# Patient Record
Sex: Male | Born: 1975 | Race: Black or African American | Hispanic: No | Marital: Married | Smoking: Current every day smoker
Health system: Southern US, Community
[De-identification: ages and names within clinical notes are randomized; demographics above are authoritative.]

## PROBLEM LIST (undated history)

## (undated) ENCOUNTER — Ambulatory Visit: Admission: EM | Payer: PRIVATE HEALTH INSURANCE

## (undated) DIAGNOSIS — M1990 Unspecified osteoarthritis, unspecified site: Secondary | ICD-10-CM

## (undated) HISTORY — PX: FRACTURE SURGERY: SHX138

## (undated) HISTORY — PX: ORTHOPEDIC SURGERY: SHX850

---

## 2013-08-08 ENCOUNTER — Emergency Department: Payer: Self-pay | Admitting: Emergency Medicine

## 2014-03-01 ENCOUNTER — Emergency Department: Payer: Self-pay | Admitting: Emergency Medicine

## 2016-12-27 ENCOUNTER — Ambulatory Visit
Admission: EM | Admit: 2016-12-27 | Discharge: 2016-12-27 | Disposition: A | Discharge status: Home / Self Care | Payer: BLUE CROSS/BLUE SHIELD | Attending: Emergency Medicine | Admitting: Emergency Medicine

## 2016-12-27 DIAGNOSIS — L03115 Cellulitis of right lower limb: Secondary | ICD-10-CM

## 2016-12-27 MED ORDER — MUPIROCIN 2 % EX OINT: 1.0000 "application " | TOPICAL_OINTMENT | Freq: Three times a day (TID) | CUTANEOUS | 0 refills | Status: DC

## 2016-12-27 MED ORDER — CEFTRIAXONE SODIUM 1 G IJ SOLR
1.0000 g | Freq: Once | INTRAMUSCULAR | Status: AC
  Administered 2016-12-27: 1 g via INTRAMUSCULAR

## 2016-12-27 MED ORDER — IBUPROFEN 800 MG PO TABS: 800.0000 mg | ORAL_TABLET | Freq: Three times a day (TID) | ORAL | 0 refills | Status: DC

## 2016-12-27 MED ORDER — CEPHALEXIN 500 MG PO CAPS: 500.0000 mg | ORAL_CAPSULE | Freq: Four times a day (QID) | ORAL | 0 refills | Status: AC

## 2016-12-27 NOTE — ED Triage Notes (Signed)
Pt with right lower leg redness and swelling above ankle. Pain 7/10 with walking, much less when still. No known injury and no travel.

## 2016-12-27 NOTE — ED Provider Notes (Signed)
HPI  SUBJECTIVE:  Jonathan Schwartz is a 41 y.o. male who presents with sharp constant anterior right lower extremity pain for the past several days. Denies pruritus or burning. Reports erythema. He reports painful swelling starting this morning. He reports chills, but no fevers, bodyaches. No trauma to the area, does not recall any insect bite or spider bite to it. No calf swelling. He's been taking Tylenol thousand milligrams every 6 hours last dose was within 6-8 hours of evaluation. Tylenol helps. Symptoms are worse with walking. He has never had symptoms like this before. No contacts with MRSA or recurrent skin infections. Past medical history negative for diabetes, hypertension, MRSA, abscesses, PE, DVT, cancer, hypercoagulability. PMD: None.    History reviewed. No pertinent past medical history.  Past Surgical History:  Procedure Laterality Date  . ORTHOPEDIC SURGERY      History reviewed. No pertinent family history.  Social History  Substance Use Topics  . Smoking status: Current Every Day Smoker    Packs/day: 0.50    Types: Cigarettes  . Smokeless tobacco: Never Used  . Alcohol use No    No current facility-administered medications for this encounter.   Current Outpatient Prescriptions:  .  acetaminophen (TYLENOL) 500 MG tablet, Take 1,000 mg by mouth as needed., Disp: , Rfl:  .  cephALEXin (KEFLEX) 500 MG capsule, Take 1 capsule (500 mg total) by mouth 4 (four) times daily. X 5 days, Disp: 20 capsule, Rfl: 0 .  ibuprofen (ADVIL,MOTRIN) 800 MG tablet, Take 1 tablet (800 mg total) by mouth 3 (three) times daily., Disp: 30 tablet, Rfl: 0 .  mupirocin ointment (BACTROBAN) 2 %, Apply 1 application topically 3 (three) times daily. Apply after warm soak for 10 minutes, Disp: 22 g, Rfl: 0  No Known Allergies   ROS  As noted in HPI.   Physical Exam  BP (!) 164/87 (BP Location: Left Arm)   Pulse (!) 110   Temp 97.6 F (36.4 C) (Oral)   Resp 20   Ht 5\' 9"  (1.753  m)   Wt 252 lb (114.3 kg)   SpO2 99%   BMI 37.21 kg/m   Constitutional: Well developed, well nourished, no acute distress Eyes:  EOMI, conjunctiva normal bilaterally HENT: Normocephalic, atraumatic,mucus membranes moist Respiratory: Normal inspiratory effort Cardiovascular: Normal rate GI: nondistended skin: No rash, skin intact Musculoskeletal: calves symmetric, nontender. Localized tenderness distal right lower extremity. Positive erythema, edema, increased temperature, mild induration anterior right lower extremity. Marked this with a permanent marker for reference measures 9.5 x 5.5 cm. Skin intact, no bruising, crusting, rash. Neurologic: Alert & oriented x 3, no focal neuro deficits Psychiatric: Speech and behavior appropriate   ED Course   Medications  cefTRIAXone (ROCEPHIN) injection 1 g (1 g Intramuscular Given 12/27/16 0929)    No orders of the defined types were placed in this encounter.   No results found for this or any previous visit (from the past 24 hour(s)). No results found.  ED Clinical Impression  Cellulitis of right lower extremity   ED Assessment/Plan  Presentation most consistent with cellulitis. Patient tachycardic, so a gram of Rocephin was given. Afebrile, but took Tylenol within 6-8 hours of evaluation.  No evidence of abscess at this time. No evidence of DVT. Plan to send home with ibuprofen 800 mg to take 1 g of Tylenol, Keflex and Bactroban.  Patient has no PMD, so instructed patient to return here in 2 days if he is not getting significantly better, he is to  go to the ED if he gets worse, fevers, chest pain, shortness of breath, calf swelling. Also provided primary care referral for routine care. Discussed labs, imaging, MDM, plan and followup with patient / parent / family. Discussed sn/sx that should prompt return to the ED. Patient / parent / family agrees with plan.   Meds ordered this encounter  Medications  . acetaminophen (TYLENOL) 500  MG tablet    Sig: Take 1,000 mg by mouth as needed.  . cefTRIAXone (ROCEPHIN) injection 1 g  . mupirocin ointment (BACTROBAN) 2 %    Sig: Apply 1 application topically 3 (three) times daily. Apply after warm soak for 10 minutes    Dispense:  22 g    Refill:  0  . cephALEXin (KEFLEX) 500 MG capsule    Sig: Take 1 capsule (500 mg total) by mouth 4 (four) times daily. X 5 days    Dispense:  20 capsule    Refill:  0  . ibuprofen (ADVIL,MOTRIN) 800 MG tablet    Sig: Take 1 tablet (800 mg total) by mouth 3 (three) times daily.    Dispense:  30 tablet    Refill:  0    *This clinic note was created using Scientist, clinical (histocompatibility and immunogenetics)Dragon dictation software. Therefore, there may be occasional mistakes despite careful proofreading.  ?   Domenick GongAshley Dijon Cosens, MD 12/27/16 [phone]

## 2021-03-12 ENCOUNTER — Encounter: Payer: Self-pay | Admitting: Emergency Medicine

## 2021-03-12 ENCOUNTER — Ambulatory Visit
Admission: EM | Admit: 2021-03-12 | Discharge: 2021-03-12 | Disposition: A | Discharge status: Home / Self Care | Payer: PRIVATE HEALTH INSURANCE | Attending: Family Medicine | Admitting: Family Medicine

## 2021-03-12 ENCOUNTER — Other Ambulatory Visit: Payer: Self-pay

## 2021-03-12 DIAGNOSIS — U071 COVID-19: Secondary | ICD-10-CM | POA: Diagnosis present

## 2021-03-12 LAB — SARS CORONAVIRUS 2 (TAT 6-24 HRS): SARS Coronavirus 2: POSITIVE — AB

## 2021-03-12 MED ORDER — MOLNUPIRAVIR 200 MG PO CAPS: 800.0000 mg | ORAL_CAPSULE | Freq: Two times a day (BID) | ORAL | 0 refills | Status: AC

## 2021-03-12 NOTE — ED Provider Notes (Signed)
[facility]    CSN: [id number] Arrival date & time: 03/12/21  6440      History   Chief Complaint Chief Complaint  Patient presents with  . Cough  . Nasal Congestion    HPI  45 year old male presents with the above complaints.  Patient reports that his symptoms started on Saturday.  He thought he was experiencing allergies but his symptoms have persisted and not improved.  He reports nasal congestion, headache, generalized weakness, cough.  No fever.  He has been "sweating".  He had a COVID test that was positive this morning.  He notified his employer and was advised to come in for evaluation.  No relieving factors.  No other complaints.  Home Medications    Prior to Admission medications   Medication Sig Start Date End Date Taking? Authorizing Provider  Molnupiravir 200 MG CAPS Take 4 capsules (800 mg total) by mouth in the morning and at bedtime for 5 days. 03/12/21 03/17/21 Yes Tommie Sams, DO    Family History History reviewed. No pertinent family history.  Social History Social History   Tobacco Use  . Smoking status: Current Every Day Smoker    Packs/day: 0.50    Types: Cigarettes  . Smokeless tobacco: Never Used  Substance Use Topics  . Alcohol use: No  . Drug use: No     Allergies   Patient has no known allergies.   Review of Systems Review of Systems  Constitutional: Negative for fever.  HENT: Positive for congestion.   Respiratory: Positive for cough.   Neurological: Positive for headaches.   Physical Exam Triage Vital Signs ED Triage Vitals  Enc Vitals Group     BP 03/12/21 0947 133/83     Pulse Rate 03/12/21 0947 96     Resp 03/12/21 0947 18     Temp 03/12/21 0947 98.4 F (36.9 C)     Temp Source 03/12/21 0947 Oral     SpO2 03/12/21 0947 99 %     Weight 03/12/21 0947 262 lb (118.8 kg)     Height 03/12/21 0947 5\' 9"  (1.753 m)     Head Circumference --      Peak Flow --      Pain Score 03/12/21 0945 0     Pain Loc --       Pain Edu? --      Excl. in GC? --    Updated Vital Signs BP 133/83 (BP Location: Right Arm)   Pulse 96   Temp 98.4 F (36.9 C) (Oral)   Resp 18   Ht 5\' 9"  (1.753 m)   Wt 118.8 kg   SpO2 99%   BMI 38.69 kg/m   Visual Acuity Right Eye Distance:   Left Eye Distance:   Bilateral Distance:    Right Eye Near:   Left Eye Near:    Bilateral Near:     Physical Exam Vitals and nursing note reviewed.  Constitutional:      General: He is not in acute distress.    Appearance: Normal appearance. He is obese. He is not ill-appearing.  HENT:     Head: Normocephalic and atraumatic.     Right Ear: Tympanic membrane normal.     Left Ear: Tympanic membrane normal.     Nose: Congestion present.     Mouth/Throat:     Pharynx: Oropharynx is clear.  Eyes:     General:        Right eye: No discharge.  Left eye: No discharge.     Conjunctiva/sclera: Conjunctivae normal.  Cardiovascular:     Rate and Rhythm: Normal rate and regular rhythm.  Pulmonary:     Effort: Pulmonary effort is normal.     Breath sounds: Normal breath sounds. No wheezing, rhonchi or rales.  Neurological:     Mental Status: He is alert.  Psychiatric:        Mood and Affect: Mood normal.        Behavior: Behavior normal.    UC Treatments / Results  Labs (all labs ordered are listed, but only abnormal results are displayed) Labs Reviewed  SARS CORONAVIRUS 2 (TAT 6-24 HRS)    EKG   Radiology No results found.  Procedures Procedures (including critical care time)  Medications Ordered in UC Medications - No data to display  Initial Impression / Assessment and Plan / UC Course  I have reviewed the triage vital signs and the nursing notes.  Pertinent labs & imaging results that were available during my care of the patient were reviewed by me and considered in my medical decision making (see chart for details).    45 year old male presents with COVID-19.  Patient had a positive home test.  He is  a smoker and is obese.  Given risk factors, placing on molnupiravir.  Information given regarding quarantine and when he may return to work.  Work note given.  Supportive care.  Final Clinical Impressions(s) / UC Diagnoses   Final diagnoses:  COVID     Discharge Instructions     Medication as prescribed.  If you worsen, go the ER.  Quarantine for 5 days (from Saturday).   Take care  Dr. Adriana Simas    ED Prescriptions    Medication Sig Dispense Auth. Provider   Molnupiravir 200 MG CAPS Take 4 capsules (800 mg total) by mouth in the morning and at bedtime for 5 days. 40 capsule Tommie Sams, DO     PDMP not reviewed this encounter.   Tommie Sams, [place] 03/12/21 1039

## 2021-03-12 NOTE — Discharge Instructions (Signed)
Medication as prescribed.  If you worsen, go the ER.  Quarantine for 5 days (from Saturday).   Take care  Dr. Adriana Simas

## 2021-03-12 NOTE — ED Triage Notes (Signed)
Patient had positive home COVID test today. He wants to take another test today to confirm this. He has been having cough, nasal congestion that started Saturday.

## 2021-03-13 ENCOUNTER — Telehealth (HOSPITAL_COMMUNITY): Payer: Self-pay

## 2021-03-13 NOTE — Telephone Encounter (Signed)
Called to discuss with patient about COVID-19 symptoms and the use of one of the available treatments for those with mild to moderate Covid symptoms and at a high risk of hospitalization.  Pt appears to qualify for outpatient treatment due to co-morbid conditions and/or a member of an at-risk group in accordance with the FDA Emergency Use Authorization.  ED MD rx molnupiravir for patient, calling to screen for monoclonal antibody treatment.     Unable to reach pt - LVM 03/13/21 @ 1734  Essie Hart, RN

## 2021-03-13 NOTE — Telephone Encounter (Signed)
Patient's wife Jonathan Schwartz called RN back about additional therapy. RN spoke with PA, Carlean Jews about possibly starting paxlovid as well. Since pt has been vaccinated and is feeling better no need for additional therapy at this time.

## 2021-07-18 ENCOUNTER — Other Ambulatory Visit: Payer: Self-pay

## 2021-07-18 ENCOUNTER — Ambulatory Visit (INDEPENDENT_AMBULATORY_CARE_PROVIDER_SITE_OTHER): Payer: PRIVATE HEALTH INSURANCE

## 2021-07-18 ENCOUNTER — Ambulatory Visit
Admission: EM | Admit: 2021-07-18 | Discharge: 2021-07-18 | Disposition: A | Discharge status: Home / Self Care | Payer: PRIVATE HEALTH INSURANCE | Attending: Family Medicine | Admitting: Family Medicine

## 2021-07-18 ENCOUNTER — Encounter: Payer: Self-pay | Admitting: Emergency Medicine

## 2021-07-18 DIAGNOSIS — M79642 Pain in left hand: Secondary | ICD-10-CM | POA: Diagnosis not present

## 2021-07-18 DIAGNOSIS — M79632 Pain in left forearm: Secondary | ICD-10-CM | POA: Diagnosis not present

## 2021-07-18 DIAGNOSIS — M25532 Pain in left wrist: Secondary | ICD-10-CM

## 2021-07-18 MED ORDER — MELOXICAM 15 MG PO TABS: 15.0000 mg | ORAL_TABLET | Freq: Every day | ORAL | 0 refills | Status: DC | PRN

## 2021-07-18 NOTE — ED Provider Notes (Signed)
[facility]    CSN: [id number] Arrival date & time: 07/18/21  0941      History   Chief Complaint Chief Complaint  Patient presents with   Wrist Pain    Left forearm    HPI  45 year old male presents with the above complaints.  Patient reports that he has had ongoing pain intermittently of his left wrist and forearm.  Has been worse as of late.  He is left-handed.  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  Pain 7/10 in severity.  No fall, trauma, injury.  He has had prior fracture and subsequent fixation of the forearm.  No other associated symptoms.  No other complaints.  Home Medications    Prior to Admission medications   Medication Sig Start Date End Date Taking? Authorizing Provider  acetaminophen (TYLENOL) 500 MG tablet Take 500 mg by mouth every 6 (six) hours as needed.   Yes [provider]  meloxicam (MOBIC) 15 MG tablet Take 1 tablet (15 mg total) by mouth daily as needed for pain. 07/18/21  Yes Tommie Sams, DO    Social History Social History   Tobacco Use   Smoking status: Every Day    Packs/day: 0.50    Types: Cigarettes   Smokeless tobacco: Never  Substance Use Topics   Alcohol use: No   Drug use: No     Allergies   Patient has no known allergies.   Review of Systems Review of Systems  Constitutional: Negative.   Musculoskeletal:        Left wrist pain and left forearm pain.    Physical Exam Triage Vital Signs ED Triage Vitals  Enc Vitals Group     BP 07/18/21 1057 133/86     Pulse Rate 07/18/21 1057 92     Resp 07/18/21 1057 20     Temp 07/18/21 1057 98.1 F (36.7 C)     Temp Source 07/18/21 1057 Oral     SpO2 07/18/21 1057 99 %     Weight --      Height --      Head Circumference --      Peak Flow --      Pain Score 07/18/21 1054 7     Pain Loc --      Pain Edu? --      Excl. in GC? --    No data found.BP 133/86 (BP Location: Right Arm)   Pulse 92   Temp 98.1 F (36.7 C) (Oral)   Resp 20   SpO2 99%   Visual  Acuity Right Eye Distance:   Left Eye Distance:   Bilateral Distance:    Right Eye Near:   Left Eye Near:    Bilateral Near:     Physical Exam Vitals and nursing note reviewed.  Constitutional:      General: He is not in acute distress.    Appearance: He is not ill-appearing.  HENT:     Head: Normocephalic and atraumatic.  Pulmonary:     Effort: Pulmonary effort is normal. No respiratory distress.  Musculoskeletal:     Comments: Left wrist and forearm - diffuse tenderness to palpation throughout the wrist and distal forearm.  Midline scar noted on the volar aspect of the forearm from prior surgery.  Neurological:     Mental Status: He is alert.  Psychiatric:        Mood and Affect: Mood normal.        Behavior: Behavior normal.  UC Treatments / Results  Labs (all labs ordered are listed, but only abnormal results are displayed) Labs Reviewed - No data to display  EKG   Radiology DG Forearm Left  Result Date: 07/18/2021 CLINICAL DATA:  Pain EXAM: LEFT HAND - COMPLETE 3+ VIEW; LEFT FOREARM - 2 VIEW COMPARISON:  None. FINDINGS: Left forearm: There is no evidence of fracture or dislocation. Chronic osseous deformity of the radius with prior plate and screw fixation. Hardware is intact. Soft tissues are unremarkable. Left hand: There is no evidence of fracture or dislocation. No significant arthropathy or focal bony lesion. Soft tissues are unremarkable. IMPRESSION: No acute osseous abnormality. Electronically Signed   By: Allegra Lai M.D.   On: 07/18/2021 12:00   DG Hand Complete Left  Result Date: 07/18/2021 CLINICAL DATA:  Pain EXAM: LEFT HAND - COMPLETE 3+ VIEW; LEFT FOREARM - 2 VIEW COMPARISON:  None. FINDINGS: Left forearm: There is no evidence of fracture or dislocation. Chronic osseous deformity of the radius with prior plate and screw fixation. Hardware is intact. Soft tissues are unremarkable. Left hand: There is no evidence of fracture or dislocation. No  significant arthropathy or focal bony lesion. Soft tissues are unremarkable. IMPRESSION: No acute osseous abnormality. Electronically Signed   By: Allegra Lai M.D.   On: 07/18/2021 12:00    Procedures Procedures (including critical care time)  Medications Ordered in UC Medications - No data to display  Initial Impression / Assessment and Plan / UC Course  I have reviewed the triage vital signs and the nursing notes.  Pertinent labs & imaging results that were available during my care of the patient were reviewed by me and considered in my medical decision making (see chart for details).    45 year old male presents with pain of the left wrist and forearm.  Diffusely tender on exam.  X-rays was obtained and was independently reviewed by me.  Interpretation: Hardware in place.  No appreciable fracture.  Advise rest, ice, elevation.  Meloxicam as directed.  If persists, needs to see orthopedics.  Final Clinical Impressions(s) / UC Diagnoses   Final diagnoses:  Left wrist pain  Pain of left forearm     Discharge Instructions      Rest, ice, elevation.  Medication as prescribed.  Please call [facility] clinic Orthopedics [phone]) OR [facility] [phone]) for an appt.    ED Prescriptions     Medication Sig Dispense Auth. Provider   meloxicam (MOBIC) 15 MG tablet Take 1 tablet (15 mg total) by mouth daily as needed for pain. 30 tablet Tommie Sams, DO      PDMP not reviewed this encounter.   Tommie Sams, DO 07/18/21 1340

## 2021-07-18 NOTE — ED Triage Notes (Signed)
Pt presents today with c/o of "discomfort" to left forearm x 1 day. He denies new injury. He does report a history of surgical repair to left forearm.

## 2021-07-18 NOTE — Discharge Instructions (Addendum)
Rest, ice, elevation.  Medication as prescribed.  Please call [facility] clinic Orthopedics [phone]) OR [facility] [phone]) for an appt.

## 2021-09-27 ENCOUNTER — Emergency Department: Payer: BLUE CROSS/BLUE SHIELD

## 2021-09-27 ENCOUNTER — Encounter: Payer: Self-pay | Admitting: Emergency Medicine

## 2021-09-27 DIAGNOSIS — W19XXXA Unspecified fall, initial encounter: Secondary | ICD-10-CM | POA: Insufficient documentation

## 2021-09-27 DIAGNOSIS — Z23 Encounter for immunization: Secondary | ICD-10-CM | POA: Diagnosis not present

## 2021-09-27 DIAGNOSIS — S0181XA Laceration without foreign body of other part of head, initial encounter: Secondary | ICD-10-CM | POA: Insufficient documentation

## 2021-09-27 DIAGNOSIS — R55 Syncope and collapse: Secondary | ICD-10-CM | POA: Diagnosis not present

## 2021-09-27 DIAGNOSIS — Z20822 Contact with and (suspected) exposure to covid-19: Secondary | ICD-10-CM | POA: Diagnosis not present

## 2021-09-27 DIAGNOSIS — F17210 Nicotine dependence, cigarettes, uncomplicated: Secondary | ICD-10-CM | POA: Insufficient documentation

## 2021-09-27 DIAGNOSIS — S0990XA Unspecified injury of head, initial encounter: Secondary | ICD-10-CM | POA: Diagnosis present

## 2021-09-27 NOTE — ED Triage Notes (Signed)
Pt in via [facility] after syncopal fall at work, where he fell and landed forward onto pallet jacks, laceration present to forehead between eyebrows. Also has swelling to nose, and some chipped teeth. Arrives a&ox4, VSS

## 2021-09-28 ENCOUNTER — Emergency Department
Admission: EM | Admit: 2021-09-28 | Discharge: 2021-09-28 | Disposition: A | Discharge status: Home / Self Care | Payer: BLUE CROSS/BLUE SHIELD | Attending: Emergency Medicine | Admitting: Emergency Medicine

## 2021-09-28 ENCOUNTER — Encounter: Payer: Self-pay | Admitting: Emergency Medicine

## 2021-09-28 ENCOUNTER — Emergency Department: Payer: BLUE CROSS/BLUE SHIELD

## 2021-09-28 DIAGNOSIS — S0181XA Laceration without foreign body of other part of head, initial encounter: Secondary | ICD-10-CM

## 2021-09-28 DIAGNOSIS — R55 Syncope and collapse: Secondary | ICD-10-CM

## 2021-09-28 DIAGNOSIS — S0990XA Unspecified injury of head, initial encounter: Secondary | ICD-10-CM

## 2021-09-28 LAB — TROPONIN I (HIGH SENSITIVITY)
Troponin I (High Sensitivity): 7 ng/L (ref <18)
Troponin I (High Sensitivity): 8 ng/L (ref <18)

## 2021-09-28 LAB — CBC
HCT: 41.5 % (ref 39.0–52.0)
Hemoglobin: 14.7 g/dL (ref 13.0–17.0)
MCH: 33 pg (ref 26.0–34.0)
MCHC: 35.4 g/dL (ref 30.0–36.0)
MCV: 93 fL (ref 80.0–100.0)
Platelets: 289 10*3/uL (ref 150–400)
RBC: 4.46 MIL/uL (ref 4.22–5.81)
RDW: 12.4 % (ref 11.5–15.5)
WBC: 11.9 10*3/uL — ABNORMAL HIGH (ref 4.0–10.5)
nRBC: 0 % (ref 0.0–0.2)

## 2021-09-28 LAB — BASIC METABOLIC PANEL
Anion gap: 8 (ref 5–15)
BUN: 20 mg/dL (ref 6–20)
CO2: 25 mmol/L (ref 22–32)
Calcium: 9.5 mg/dL (ref 8.9–10.3)
Chloride: 108 mmol/L (ref 98–111)
Creatinine, Ser: 0.97 mg/dL (ref 0.61–1.24)
GFR, Estimated: >60 mL/min (ref >60)
Glucose, Bld: 149 mg/dL — ABNORMAL HIGH (ref 70–99)
Potassium: 3.5 mmol/L (ref 3.5–5.1)
Sodium: 141 mmol/L (ref 135–145)

## 2021-09-28 LAB — RESP PANEL BY RT-PCR (FLU A&B, COVID) ARPGX2
Influenza A by PCR: NEGATIVE
Influenza B by PCR: NEGATIVE
SARS Coronavirus 2 by RT PCR: NEGATIVE

## 2021-09-28 MED ORDER — LIDOCAINE HCL (PF) 1 % IJ SOLN
5.0000 mL | Freq: Once | INTRAMUSCULAR | Status: AC
  Administered 2021-09-28: 5 mL
  Filled 2021-09-28: qty 5

## 2021-09-28 MED ORDER — TETANUS-DIPHTH-ACELL PERTUSSIS 5-2.5-18.5 LF-MCG/0.5 IM SUSY
0.5000 mL | PREFILLED_SYRINGE | Freq: Once | INTRAMUSCULAR | Status: AC
  Administered 2021-09-28: 0.5 mL via INTRAMUSCULAR
  Filled 2021-09-28: qty 0.5

## 2021-09-28 NOTE — ED Provider Notes (Signed)
[facility] Emergency Department Provider Note   ____________________________________________    I have reviewed the triage vital signs and the nursing notes.   HISTORY  Chief Complaint Head Injury and Loss of Consciousness     HPI Jonathan Schwartz is a 45 y.o. male who presents after syncopal episode with head injury.  Patient reports he was at work, feeling somewhat fatigued has had a cough over the last several days.  Reports he had a particularly hard cough which made him feel lightheaded and he apparently passed out and fell forward.  He woke up on the floor with some bleeding from his forehead.  Currently is feeling well has no complaints.  No palpitations or chest pain.  No extremity injuries.  No new medications.  History reviewed. No pertinent past medical history.  There are no problems to display for this patient.   Past Surgical History:  Procedure Laterality Date   ORTHOPEDIC SURGERY      Prior to Admission medications   Medication Sig Start Date End Date Taking? Authorizing Provider  acetaminophen (TYLENOL) 500 MG tablet Take 500 mg by mouth every 6 (six) hours as needed.    [provider]  meloxicam (MOBIC) 15 MG tablet Take 1 tablet (15 mg total) by mouth daily as needed for pain. 07/18/21   Tommie Sams, DO     Allergies Patient has no known allergies.  No family history on file.  Social History Social History   Tobacco Use   Smoking status: Every Day    Packs/day: 0.50    Types: Cigarettes   Smokeless tobacco: Never  Substance Use Topics   Alcohol use: No   Drug use: No    Review of Systems  Constitutional: No fever/chills Eyes: No visual changes.  ENT: Chipped tooth Cardiovascular: Denies chest pain. Respiratory: Denies shortness of breath. Gastrointestinal: No abdominal pain.  No nausea, no vomiting.   Genitourinary: Negative for dysuria. Musculoskeletal: Negative for back pain. Skin:  Positive laceration Neurological: Negative for headaches or weakness   ____________________________________________   PHYSICAL EXAM:  VITAL SIGNS: ED Triage Vitals [09/27/21 2340]  Enc Vitals Group     BP 133/88     Pulse Rate 92     Resp 18     Temp 98.2 F (36.8 C)     Temp Source Oral     SpO2 96 %     Weight      Height      Head Circumference      Peak Flow      Pain Score      Pain Loc      Pain Edu?      Excl. in GC?     Constitutional: Alert and oriented. No acute distress. Pleasant and interactive Eyes: Conjunctivae are normal.  Head: Swelling to the bridge of the nose, swelling to the upper lip, laceration, 3 cm horizontal central forehead Nose: Swelling as above, no rhinorrhea Mouth/Throat: Mucous membranes are moist.  Chipped left upper incisor Neck:  Painless ROM, no pain with axial load Cardiovascular: Normal rate, regular rhythm. Grossly normal heart sounds.  Good peripheral circulation.  No chest wall tenderness to palpation Respiratory: Normal respiratory effort.  No retractions. Lungs CTAB. Gastrointestinal: Soft and nontender. No distention.  No CVA tenderness. Genitourinary: deferred Musculoskeletal: No lower extremity tenderness nor edema.  Warm and well perfused.  Full range of motion of all extremities Neurologic:  Normal speech and language. No gross focal  neurologic deficits are appreciated.  Skin:  Skin is warm, dry, laceration as above Psychiatric: Mood and affect are normal. Speech and behavior are normal.  ____________________________________________   LABS (all labs ordered are listed, but only abnormal results are displayed)  Labs Reviewed  BASIC METABOLIC PANEL - Abnormal; Notable for the following components:      Result Value   Glucose, Bld 149 (*)    All other components within normal limits  CBC - Abnormal; Notable for the following components:   WBC 11.9 (*)    All other components within normal limits  RESP PANEL BY RT-PCR  (FLU A&B, COVID) ARPGX2  URINALYSIS, ROUTINE W REFLEX MICROSCOPIC  TROPONIN I (HIGH SENSITIVITY)  TROPONIN I (HIGH SENSITIVITY)   ____________________________________________  EKG  ED ECG REPORT I, Jene Every, the attending physician, personally viewed and interpreted this ECG.  Date: 09/28/2021  Rhythm: normal sinus rhythm QRS Axis: normal Intervals: normal ST/T Wave abnormalities: Nonspecific changes Narrative Interpretation: no evidence of acute ischemia  ____________________________________________  RADIOLOGY  CT head cervical spine max face reviewed by me, no acute fractures, confirmed by radiology ____________________________________________   PROCEDURES  Procedure(s) performed: yes  .Marland KitchenLaceration Repair  Date/Time: 09/28/2021 9:38 AM Performed by: Jene Every, MD Authorized by: Jene Every, MD   Consent:    Consent obtained:  Verbal   Risks discussed:  Infection, pain and poor cosmetic result Anesthesia:    Anesthesia method:  Local infiltration   Local anesthetic:  Lidocaine 1% w/o epi Laceration details:    Location:  Face   Face location:  Forehead   Length (cm):  3 Exploration:    Wound exploration: entire depth of wound visualized     Contaminated: no   Treatment:    Area cleansed with:  Saline   Amount of cleaning:  Standard Skin repair:    Repair method:  Sutures   Suture size:  5-0   Suture material:  Nylon   Suture technique:  Simple interrupted   Number of sutures:  4 Approximation:    Approximation:  Close Post-procedure details:    Procedure completion:  Tolerated   Critical Care performed: No ____________________________________________   INITIAL IMPRESSION / ASSESSMENT AND PLAN / ED COURSE  Pertinent labs & imaging results that were available during my care of the patient were reviewed by me and considered in my medical decision making (see chart for details).   Patient with syncopal episode as described above  after a particularly harsh cough, possible vasovagal event.  Overall well-appearing vital signs reassuring here.  EKG troponin unremarkable.  CT imaging is reassuring, no broken bones.  Did suffer a chipped upper front tooth.  Laceration to the forehead repaired by me, suture removal 5 days.  Home with rest hydration outpatient follow-up, return precautions discussed    ____________________________________________   FINAL CLINICAL IMPRESSION(S) / ED DIAGNOSES  Final diagnoses:  Syncope and collapse  Injury of head, initial encounter  Forehead laceration, initial encounter        Note:  This document was prepared using Dragon voice recognition software and may include unintentional dictation errors.    Jene Every, MD 09/28/21 [phone]

## 2021-09-28 NOTE — ED Provider Notes (Signed)
HPI: Pt is a 45 y.o. male who presents with complaints of syncope  The patient p/w  coughing and then syncope episode. Hit head. Unsure tetanus No chest pain, no abd pain.   ROS: Denies fever, chest pain, vomiting  History reviewed. No pertinent past medical history. Vitals:   09/27/21 2340 09/28/21 0422  BP: 133/88 126/83  Pulse: 92 97  Resp: 18 20  Temp: 98.2 F (36.8 C) 98.1 F (36.7 C)  SpO2: 96% 95%    Focused Physical Exam: Gen: No acute distress Head: atraumatic, normocephalic Eyes: Extraocular movements grossly intact; conjunctiva clear CV: RRR Lung: No increased WOB, no stridor GI: ND, no obvious masses Neuro: Alert and awake  Medical Decision Making and Plan: Given the patient's initial medical screening exam, the following diagnostic evaluation has been ordered. The patient will be placed in the appropriate treatment space, once one is available, to complete the evaluation and treatment. I have discussed the plan of care with the patient and I have advised the patient that an ED physician or mid-level practitioner will reevaluate their condition after the test results have been received, as the results may give them additional insight into the type of treatment they may need.   Diagnostics: ct   Treatments: none immediately   Concha Se, MD 09/28/21 [phone]

## 2022-01-14 ENCOUNTER — Other Ambulatory Visit: Payer: Self-pay

## 2022-01-14 ENCOUNTER — Ambulatory Visit
Admission: EM | Admit: 2022-01-14 | Discharge: 2022-01-14 | Disposition: A | Discharge status: Home / Self Care | Payer: Self-pay | Attending: Internal Medicine | Admitting: Internal Medicine

## 2022-01-14 DIAGNOSIS — M25562 Pain in left knee: Secondary | ICD-10-CM

## 2022-01-14 MED ORDER — KETOROLAC TROMETHAMINE 60 MG/2ML IM SOLN
60.0000 mg | Freq: Once | INTRAMUSCULAR | Status: AC
  Administered 2022-01-14: 60 mg via INTRAMUSCULAR

## 2022-01-14 MED ORDER — NAPROXEN 500 MG PO TABS: 500.0000 mg | ORAL_TABLET | Freq: Two times a day (BID) | ORAL | 0 refills | Status: DC

## 2022-01-14 NOTE — Discharge Instructions (Addendum)
Symptoms and exam today are consistent with episodic knee inflammation, caused by something like gout.  Good partial relief occurred within 35 minutes of an injection of ketorolac (anti inflammatory/pain reliever) today at the urgent care.  Prescription for naprosyn (anti inflammatory/pain reliever) was sent to the pharmacy.  Please start this medicine tonight and continue it until left knee pain has been resolved for 2-3 days.  Ice to the knee for 10-15 minutes several times daily may also provide some pain relief.  Note for work. Recheck or followup with a sports med provider or orthopedist as needed if symptoms do not resolve as expected.

## 2022-01-14 NOTE — ED Triage Notes (Signed)
Pt c/o Left knee   Pt states that his left knee does not have any cartilage and has been swelling x1week.

## 2022-01-15 NOTE — ED Provider Notes (Signed)
[facility]    CSN: [id number] Arrival date & time: 01/14/22  1301      History   Chief Complaint Chief Complaint  Patient presents with   Knee Pain   Leg Pain    HPI Jonathan Schwartz is a 46 y.o. male. He presents today with severe constant pain in the L knee that has increased steadily over the last couple days, after starting intermittently about a week ago.  He was sent to the urgent care from work after arriving there unable to work due to pain in the left knee/leg.   He attributes the pain to "low cartilage" in the L knee, from playing football in high school.  He does not call a specific recent event that triggered the onset of this pain, thinks it might be from getting into/out of work and personal vehicles at different heights from the ground.  Was not struck and has not fallen.   Has had episodes of pain in the past; pain does not tend to persist between episodes.   Looks miserable--can hardly stand still and uncomfortable to sit also because of pain with knee position.  Wife had to help him put socks on this am. Tylenol is not providing much relief.  Has tried elevating the leg and trying to stay off it.  Couldn't sleep last night.  Does not have an orthopedist or sports med provider.  HPI  History reviewed. No pertinent past medical history.  There are no problems to display for this patient.   Past Surgical History:  Procedure Laterality Date   ORTHOPEDIC SURGERY         Home Medications    Prior to Admission medications   Medication Sig Start Date End Date Taking? Authorizing Provider  acetaminophen (TYLENOL) 500 MG tablet Take 500 mg by mouth every 6 (six) hours as needed.   Yes [provider]  meloxicam (MOBIC) 15 MG tablet Take 1 tablet (15 mg total) by mouth daily as needed for pain. 07/18/21  Yes Cook, Jayce G, DO  naproxen (NAPROSYN) 500 MG tablet Take 1 tablet (500 mg total) by mouth 2 (two) times daily. Continue medicine for  2-3 days after left knee pain has resolved 01/14/22  Yes [place] Scrape Renie Ora, MD    Family History History reviewed. No pertinent family history.  Social History Social History   Tobacco Use   Smoking status: Every Day    Packs/day: 0.50    Types: Cigarettes   Smokeless tobacco: Never  Vaping Use   Vaping Use: Never used  Substance Use Topics   Alcohol use: No   Drug use: No     Allergies   Patient has no known allergies.   Review of Systems Review of Systems see HPI   Physical Exam Triage Vital Signs ED Triage Vitals  Enc Vitals Group     BP 01/14/22 1410 (!) 151/96     Pulse Rate 01/14/22 1410 93     Resp 01/14/22 1410 18     Temp 01/14/22 1410 98.1 F (36.7 C)     Temp Source 01/14/22 1410 Oral     SpO2 01/14/22 1410 98 %     Weight 01/14/22 1407 262 lb (118.8 kg)     Height 01/14/22 1407 5\' 9"  (1.753 m)     Pain Score 01/14/22 1407 9     Pain Loc --    Updated Vital Signs BP (!) 151/96 (BP Location: Left Arm)    Pulse 93  Temp 98.1 F (36.7 C) (Oral)    Resp 18    Ht 5\' 9"  (1.753 m)    Wt 118.8 kg    SpO2 98%    BMI 38.69 kg/m   Physical Exam Constitutional:      General: He is in acute distress.     Appearance: He is not ill-appearing.     Comments: Good hygiene Looks miserable, grimacing and standing hunched over exam table  HENT:     Head: Atraumatic.     Mouth/Throat:     Mouth: Mucous membranes are moist.  Eyes:     Conjunctiva/sclera:     Right eye: Right conjunctiva is not injected. No exudate.    Left eye: Left conjunctiva is not injected. No exudate.    Comments: Conjugate gaze observed  Cardiovascular:     Rate and Rhythm: Normal rate.  Pulmonary:     Effort: Pulmonary effort is normal. No respiratory distress.  Abdominal:     General: There is no distension.  Musculoskeletal:     Cervical back: Neck supple.       Legs:     Comments: Marked limping, but walked into urgent care without assistive device or chair Subtle warmth  and effusion to left knee compared to right Position of least discomfort for left knee is extended to just less than full 180 extension, not able to flex or extend knee outside of this without extreme discomfort No bruising, erythema appreciated Very painful to weight bear Knee is painful to palpation, diffusely and particularly in site marked on diagram  Skin:    General: Skin is warm and dry.     Comments: No rash, no cyanosis  Neurological:     Mental Status: He is alert.     Comments: Face symmetric, speech clear/coherent/logical     UC Treatments / Results  Labs (all labs ordered are listed, but only abnormal results are displayed) Labs Reviewed - No data to display NA  EKG NA  Radiology No results found. NA  Procedures Procedures (including critical care time) NA  Medications Ordered in UC Medications  ketorolac (TORADOL) injection 60 mg (60 mg Intramuscular Given 01/14/22 1455)    Initial Impression / Assessment and Plan / UC Course  Tremendous decrease in pain within 35 minutes of toradol injection strongly suggests inflammatory component.  Differential dx includes gout, pseudogout, other inflammatory arthropathy.  Patient says possibility of gout has been discussed with him in the past.     Final Clinical Impressions(s) / UC Diagnoses   Final diagnoses:  Acute pain of left knee     Discharge Instructions      Symptoms and exam today are consistent with episodic knee inflammation, caused by something like gout.  Good partial relief occurred within 35 minutes of an injection of ketorolac (anti inflammatory/pain reliever) today at the urgent care.  Prescription for naprosyn (anti inflammatory/pain reliever) was sent to the pharmacy.  Please start this medicine tonight and continue it until left knee pain has been resolved for 2-3 days.  Ice to the knee for 10-15 minutes several times daily may also provide some pain relief.  Note for work. Recheck or followup  with a sports med provider or orthopedist as needed if symptoms do not resolve as expected.   ED Prescriptions     Medication Sig Dispense Auth. Provider   naproxen (NAPROSYN) 500 MG tablet Take 1 tablet (500 mg total) by mouth 2 (two) times daily. Continue medicine for 2-3 days  after left knee pain has resolved 30 tablet Isa Rankin, MD      PDMP not reviewed this encounter.   Isa Rankin, MD 01/15/22 1054

## 2022-05-09 IMAGING — CT CT MAXILLOFACIAL W/O CM
3 of 4 series · 13 of 47 positions shown, 16 images · non-contrast
Comparison: None.

CLINICAL DATA: Facial trauma; Neck trauma, midline tenderness (Age
16-64y). Fall with face laceration and swelling

EXAM:
CT HEAD WITHOUT CONTRAST
CT MAXILLOFACIAL WITHOUT CONTRAST
CT CERVICAL SPINE WITHOUT CONTRAST
TECHNIQUE: Multidetector CT imaging of the head, cervical spine, and
maxillofacial structures were performed using the standard protocol
without intravenous contrast. Multiplanar CT image reconstructions
of the cervical spine and maxillofacial structures were also
generated.

[Series 2: max soft · axial · 0.40mm/px · z∈[-183,-33]mm · 9 of 87 slices shown, 12 images]
[im 6/87  brain]
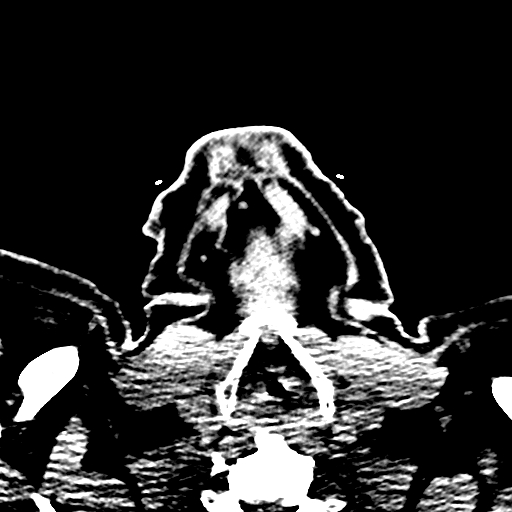
[im 6/87  bone]
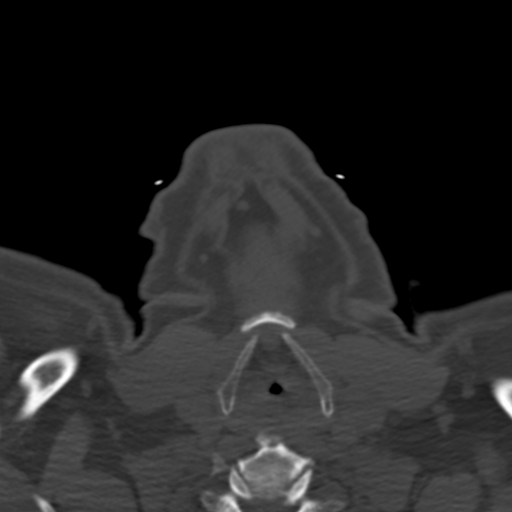
[im 15/87  bone]
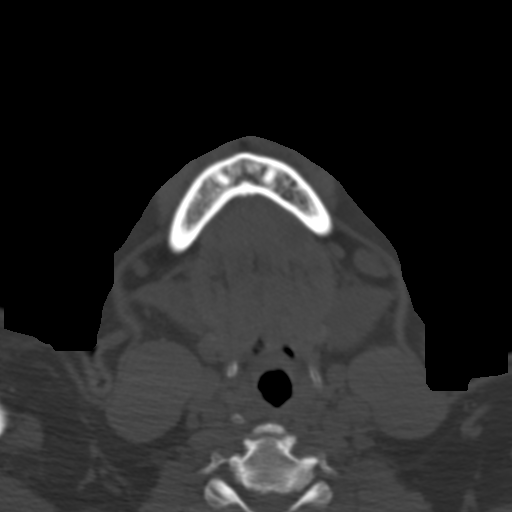
[im 24/87  bone]
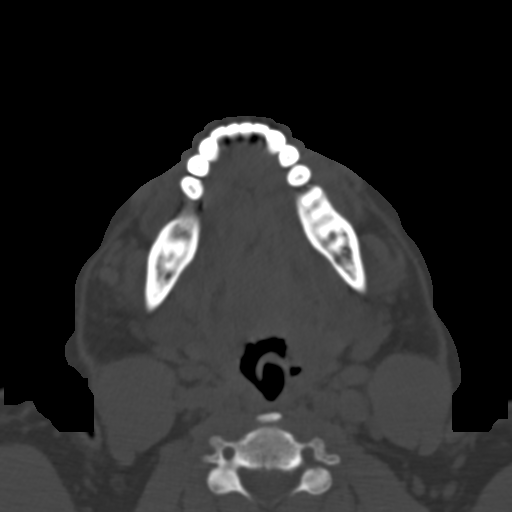
[im 33/87  bone]
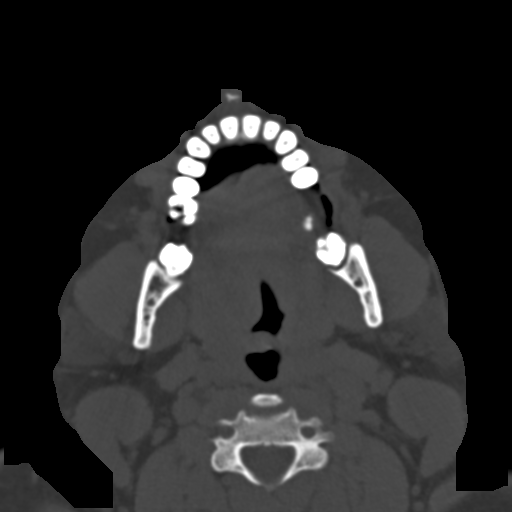
[im 45/87  brain]
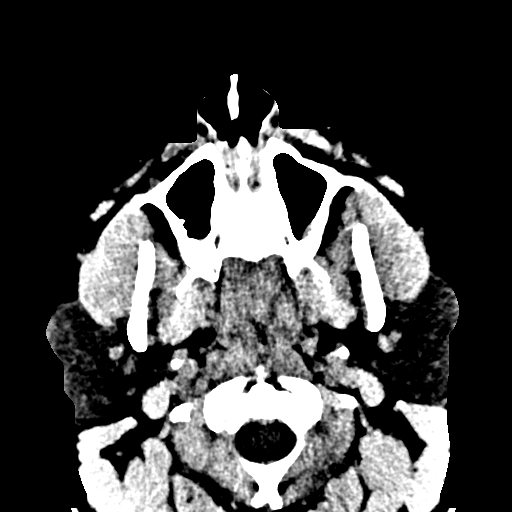
[im 45/87  bone]
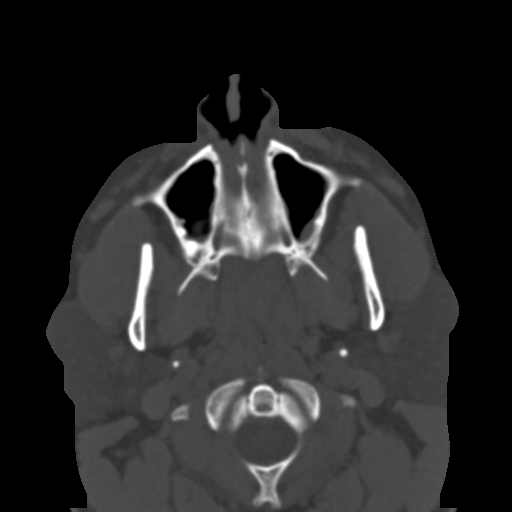
[im 54/87  bone]
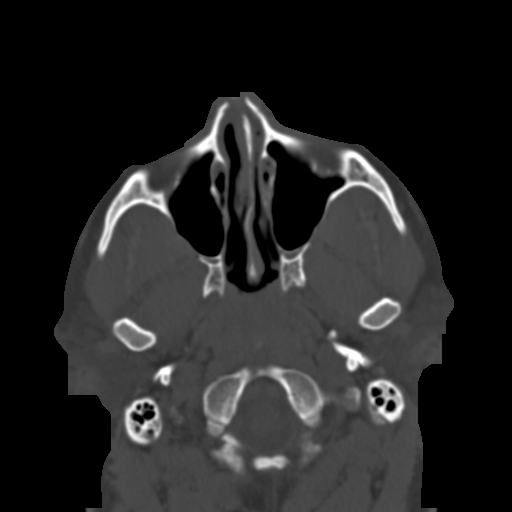
[im 63/87  bone]
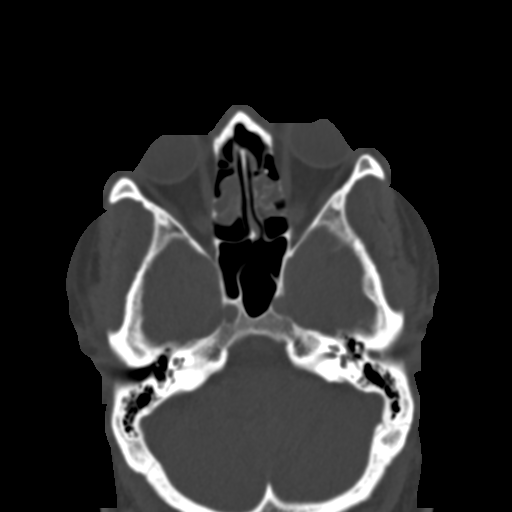
[im 72/87  bone]
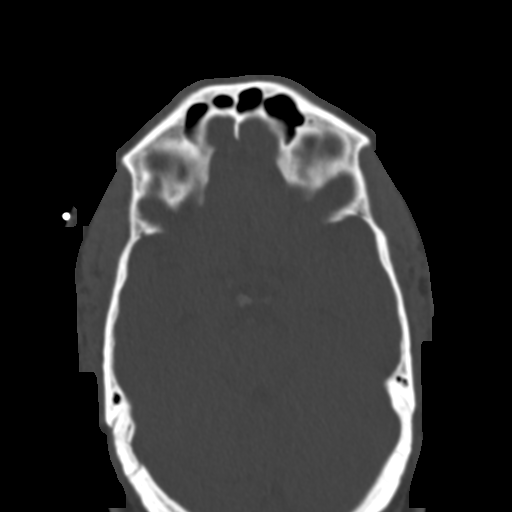
[im 81/87  brain]
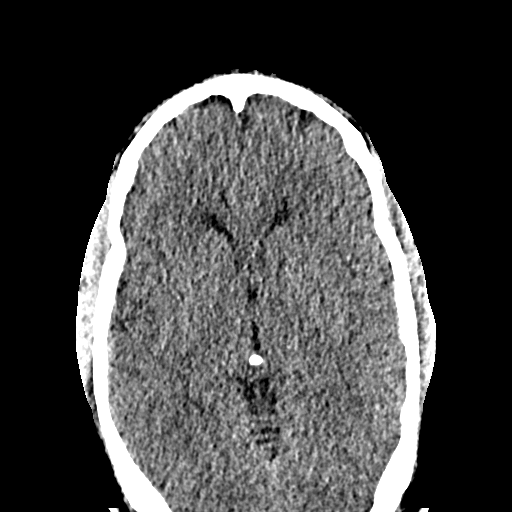
[im 81/87  bone]
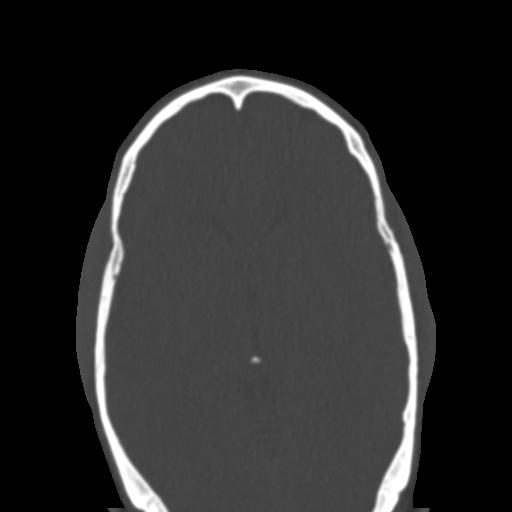

[Series 6: coronal soft · coronal · 0.37mm/px · 3 of 142 slices shown]
[im 48/142  bone]
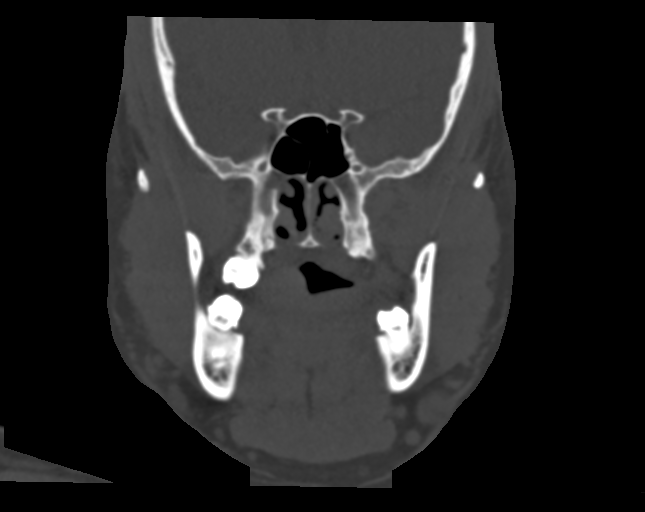
[im 63/142  bone]
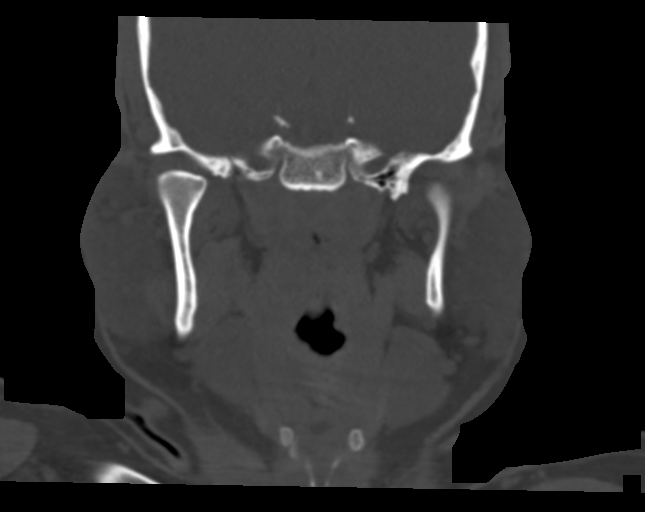
[im 79/142  bone]
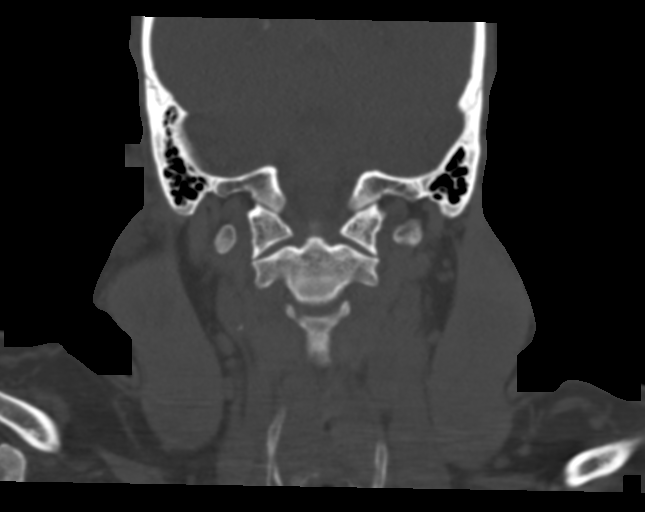

[Series 9: sagittal bone · sagittal · 0.33mm/px · 1 of 93 slices shown]
[im 47/93  bone]
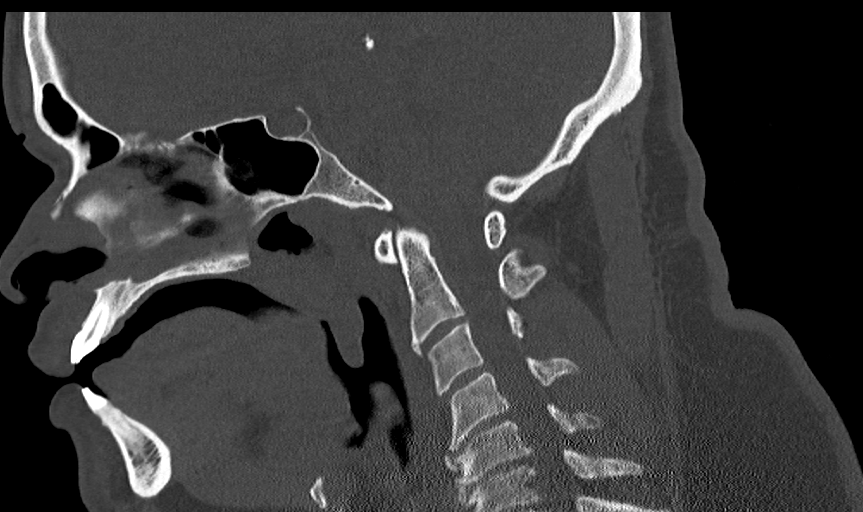

[13 of 47 positions shown; findings below may reference images not displayed]

FINDINGS: CT HEAD FINDINGS

Brain: Normal anatomic configuration. No abnormal intra or
extra-axial mass lesion or fluid collection. No abnormal mass effect
or midline shift. No evidence of acute intracranial hemorrhage or
infarct. Ventricular size is normal. Cerebellum unremarkable.

Vascular: Unremarkable

Skull: Intact

Other: Mastoid air cells and middle ear cavities are clear.

CT MAXILLOFACIAL FINDINGS

Osseous: No fracture or mandibular dislocation. No destructive
process.

Orbits: Negative. No traumatic or inflammatory finding.

Sinuses: There is opacification of a several ethmoid air cells
bilaterally. Remaining paranasal sinuses are clear.

Soft tissues: There is soft tissue swelling superficial to the
frontal bone and nasal bridge with a small superficial laceration
noted at the glabella.

CT CERVICAL SPINE FINDINGS

Alignment: Mild reversal of the normal cervical lordosis. No
listhesis.

Skull base and vertebrae: Craniocervical alignment is normal. The
atlantodental interval is not widened. No acute fracture of the
cervical spine. Vertebral body height is preserved.

Soft tissues and spinal canal: No prevertebral fluid or swelling. No
visible canal hematoma. Posterior disc osteophyte complex at C5-6
results in moderate central canal stenosis with flattening of the
thecal sac. Milder canal stenosis is noted at C6-7 and C4-5
secondary to similar changes.

Disc levels: There is intervertebral disc space narrowing and
endplate remodeling at C3-T1, most severe at C5-T1 in keeping with
changes of mild to moderate degenerative disc disease. The
prevertebral soft tissues are not thickened. Review of the axial
images demonstrates multilevel uncovertebral arthrosis resulting in
multilevel moderate to severe neuroforaminal narrowing, most severe
on the left at C5-6 and C6-7 and on the right at C4-5.

Upper chest: Negative.

Other: None
IMPRESSION: No acute intracranial abnormality.  No calvarial fracture.

No acute facial fracture. Soft tissue swelling superficial to the
frontal bone and nasal bridge with small superficial soft tissue
laceration at the glabella.

No acute fracture or listhesis of the cervical spine.

Multilevel degenerative disc and degenerative joint disease
resulting in moderate central canal stenosis, most severe at C5-6
and multilevel neuroforaminal narrowing as described above.

## 2022-05-09 IMAGING — CT CT HEAD W/O CM
3 series · 14 of 47 positions shown, 16 images · non-contrast
Comparison: None.

CLINICAL DATA: Facial trauma; Neck trauma, midline tenderness (Age
16-64y). Fall with face laceration and swelling

EXAM:
CT HEAD WITHOUT CONTRAST
CT MAXILLOFACIAL WITHOUT CONTRAST
CT CERVICAL SPINE WITHOUT CONTRAST
TECHNIQUE: Multidetector CT imaging of the head, cervical spine, and
maxillofacial structures were performed using the standard protocol
without intravenous contrast. Multiplanar CT image reconstructions
of the cervical spine and maxillofacial structures were also
generated.

[Series 2: head wo · axial · 0.47mm/px · z∈[-62,+67]mm · 8 of 32 slices shown, 10 images]
[im 3/32  brain]
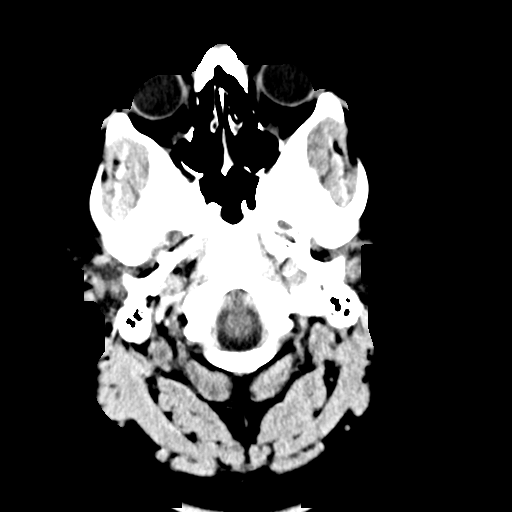
[im 3/32  bone]
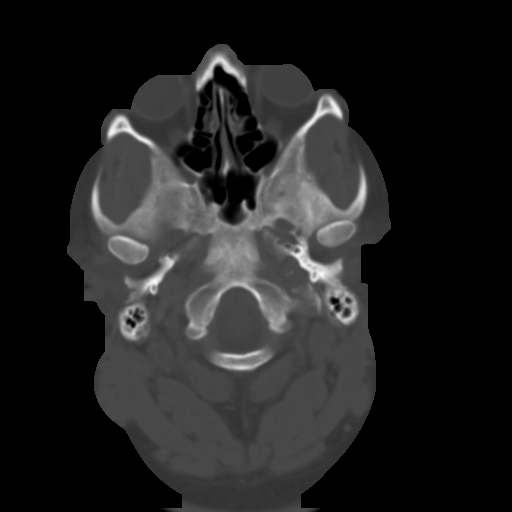
[im 7/32  brain]
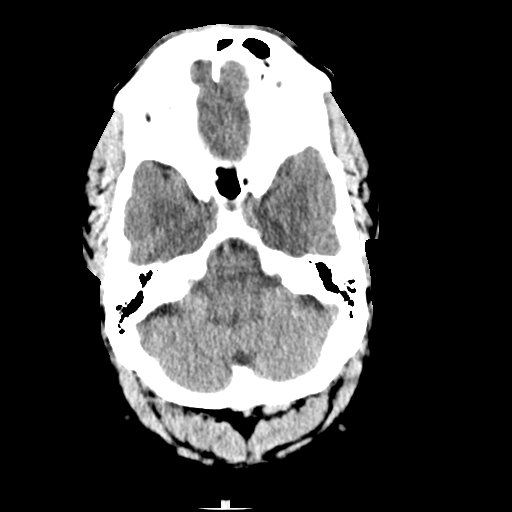
[im 10/32  brain]
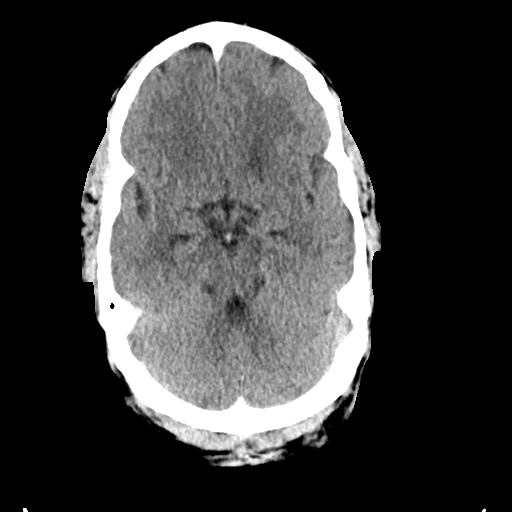
[im 14/32  brain]
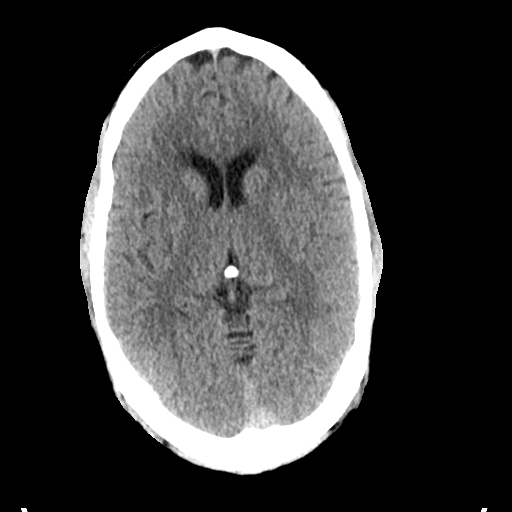
[im 18/32  brain]
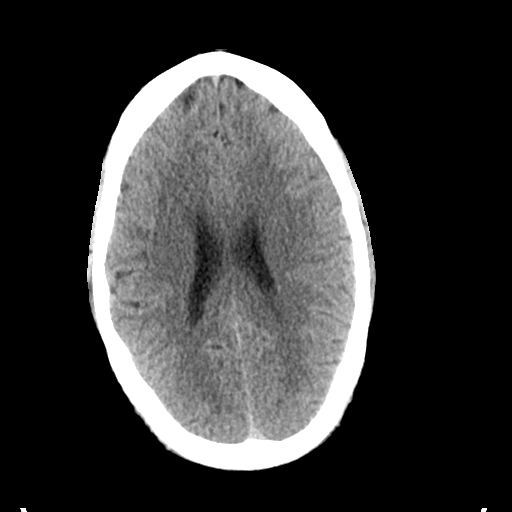
[im 18/32  bone]
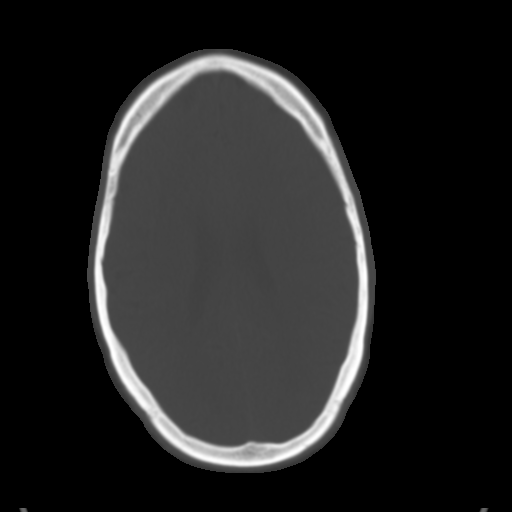
[im 22/32  brain]
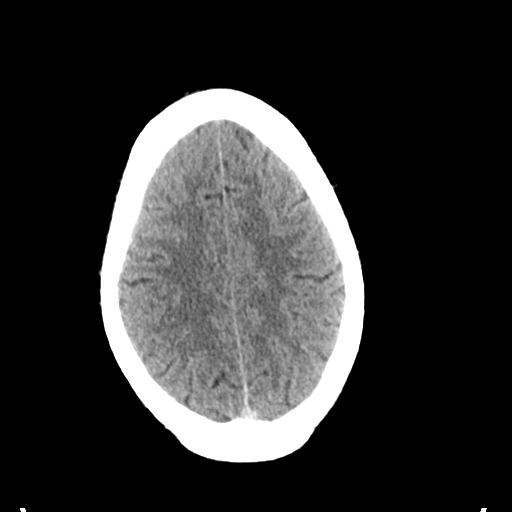
[im 25/32  brain]
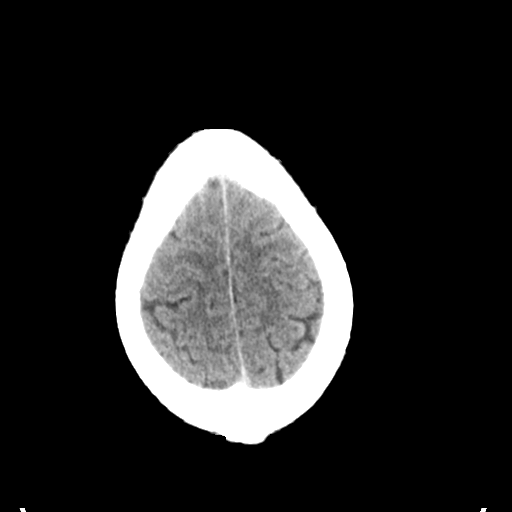
[im 29/32  brain]
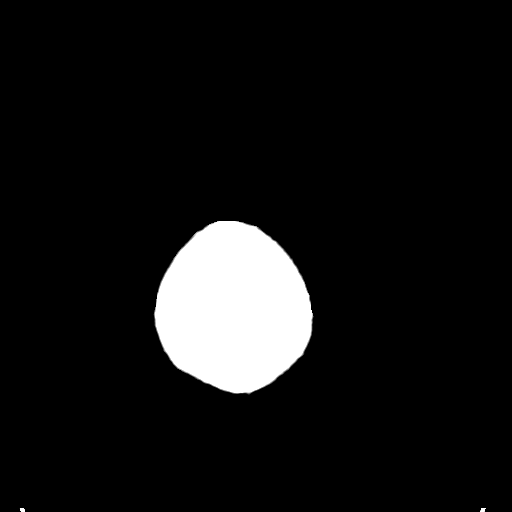

[Series 4: coronal soft tissue · coronal · 0.38mm/px · 3 of 77 slices shown]
[im 26/77  brain]
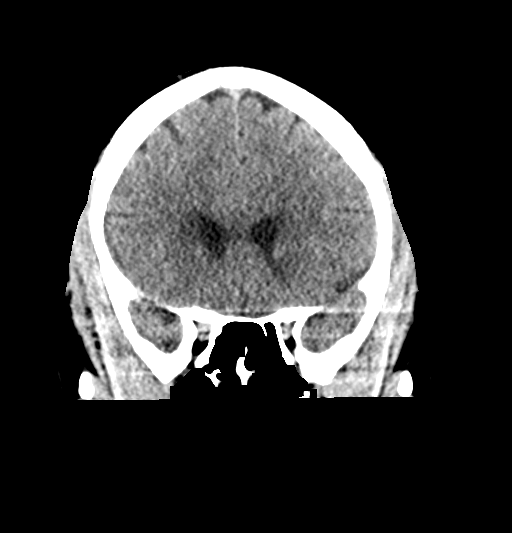
[im 34/77  brain]
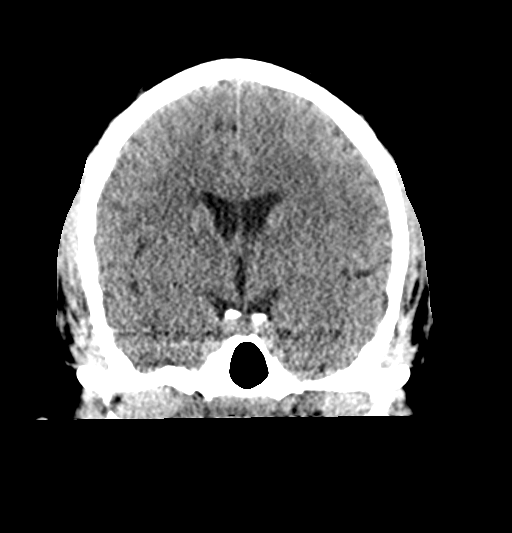
[im 43/77  brain]
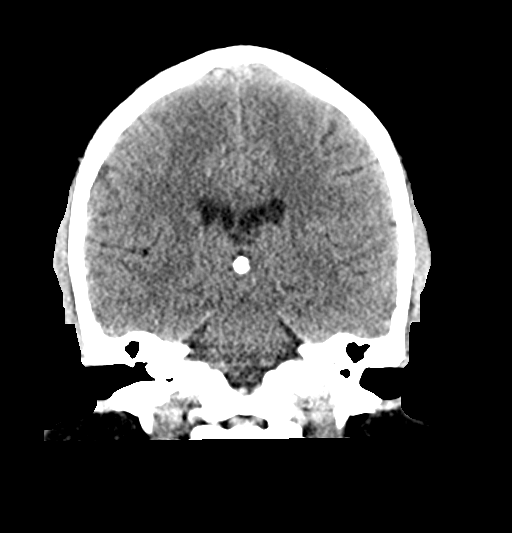

[Series 5: sagittal soft tissue · sagittal · 0.38mm/px · 3 of 58 slices shown]
[im 20/58  brain]
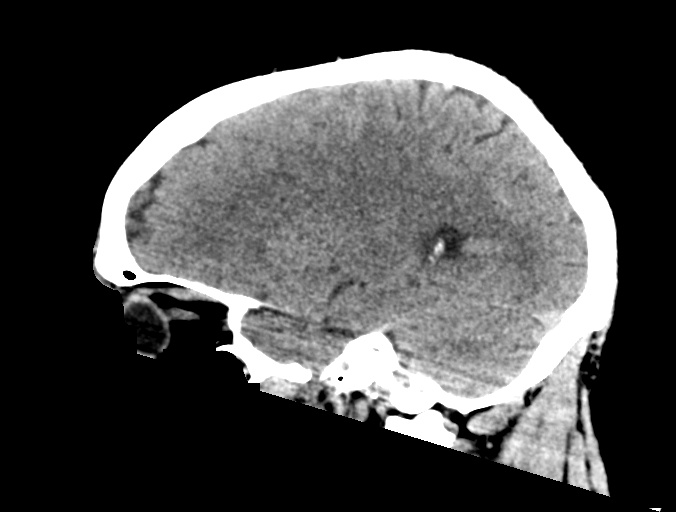
[im 29/58  brain]
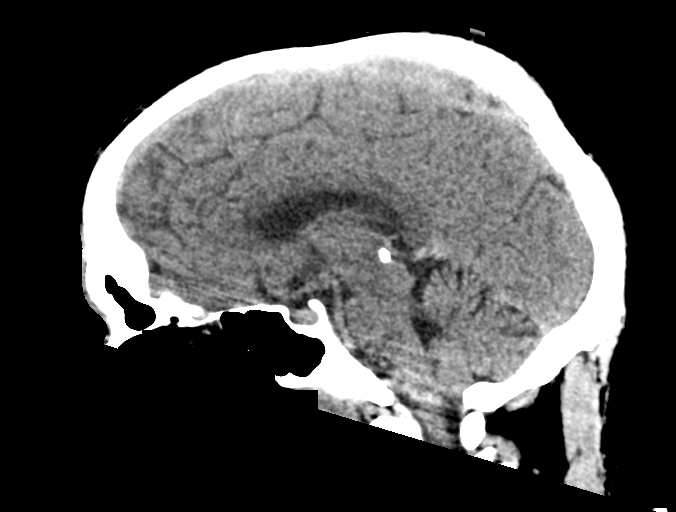
[im 39/58  brain]
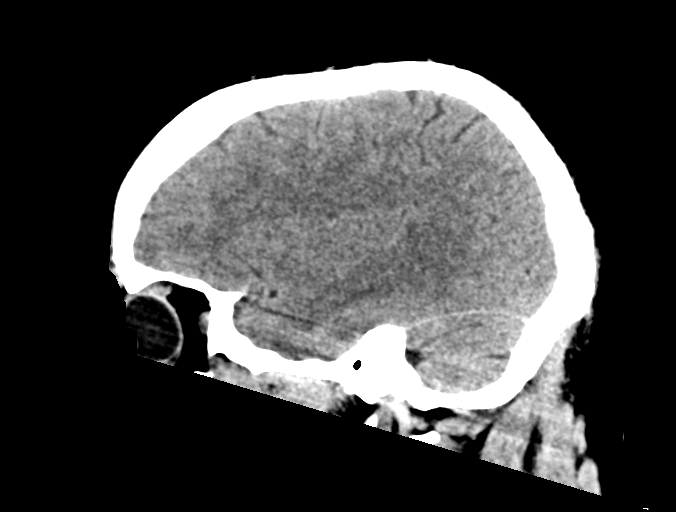

[14 of 47 positions shown; findings below may reference images not displayed]

FINDINGS: CT HEAD FINDINGS

Brain: Normal anatomic configuration. No abnormal intra or
extra-axial mass lesion or fluid collection. No abnormal mass effect
or midline shift. No evidence of acute intracranial hemorrhage or
infarct. Ventricular size is normal. Cerebellum unremarkable.

Vascular: Unremarkable

Skull: Intact

Other: Mastoid air cells and middle ear cavities are clear.

CT MAXILLOFACIAL FINDINGS

Osseous: No fracture or mandibular dislocation. No destructive
process.

Orbits: Negative. No traumatic or inflammatory finding.

Sinuses: There is opacification of a several ethmoid air cells
bilaterally. Remaining paranasal sinuses are clear.

Soft tissues: There is soft tissue swelling superficial to the
frontal bone and nasal bridge with a small superficial laceration
noted at the glabella.

CT CERVICAL SPINE FINDINGS

Alignment: Mild reversal of the normal cervical lordosis. No
listhesis.

Skull base and vertebrae: Craniocervical alignment is normal. The
atlantodental interval is not widened. No acute fracture of the
cervical spine. Vertebral body height is preserved.

Soft tissues and spinal canal: No prevertebral fluid or swelling. No
visible canal hematoma. Posterior disc osteophyte complex at C5-6
results in moderate central canal stenosis with flattening of the
thecal sac. Milder canal stenosis is noted at C6-7 and C4-5
secondary to similar changes.

Disc levels: There is intervertebral disc space narrowing and
endplate remodeling at C3-T1, most severe at C5-T1 in keeping with
changes of mild to moderate degenerative disc disease. The
prevertebral soft tissues are not thickened. Review of the axial
images demonstrates multilevel uncovertebral arthrosis resulting in
multilevel moderate to severe neuroforaminal narrowing, most severe
on the left at C5-6 and C6-7 and on the right at C4-5.

Upper chest: Negative.

Other: None
IMPRESSION: No acute intracranial abnormality.  No calvarial fracture.

No acute facial fracture. Soft tissue swelling superficial to the
frontal bone and nasal bridge with small superficial soft tissue
laceration at the glabella.

No acute fracture or listhesis of the cervical spine.

Multilevel degenerative disc and degenerative joint disease
resulting in moderate central canal stenosis, most severe at C5-6
and multilevel neuroforaminal narrowing as described above.

## 2022-05-09 IMAGING — CT CT CERVICAL SPINE W/O CM
3 of 4 series · 12 of 33 positions shown, 14 images · non-contrast
Comparison: None.

CLINICAL DATA: Facial trauma; Neck trauma, midline tenderness (Age
16-64y). Fall with face laceration and swelling

EXAM:
CT HEAD WITHOUT CONTRAST
CT MAXILLOFACIAL WITHOUT CONTRAST
CT CERVICAL SPINE WITHOUT CONTRAST
TECHNIQUE: Multidetector CT imaging of the head, cervical spine, and
maxillofacial structures were performed using the standard protocol
without intravenous contrast. Multiplanar CT image reconstructions
of the cervical spine and maxillofacial structures were also
generated.

[Series 4: sagittal bone · sagittal · 0.40mm/px · 5 of 89 slices shown, 6 images]
[im 30/89  bone]
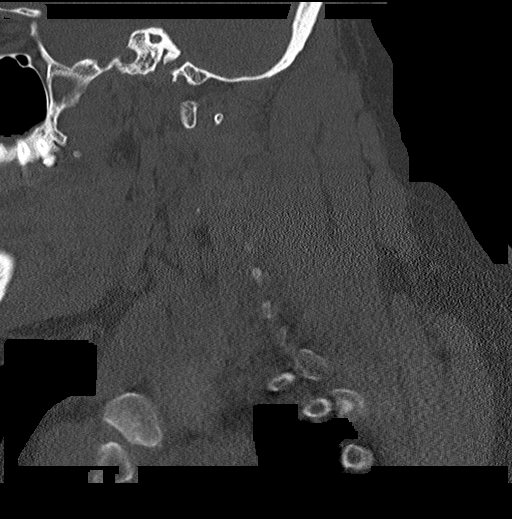
[im 37/89  bone]
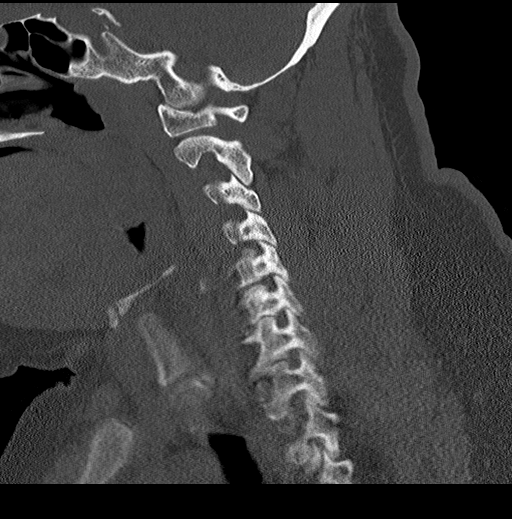
[im 45/89  soft-tissue]
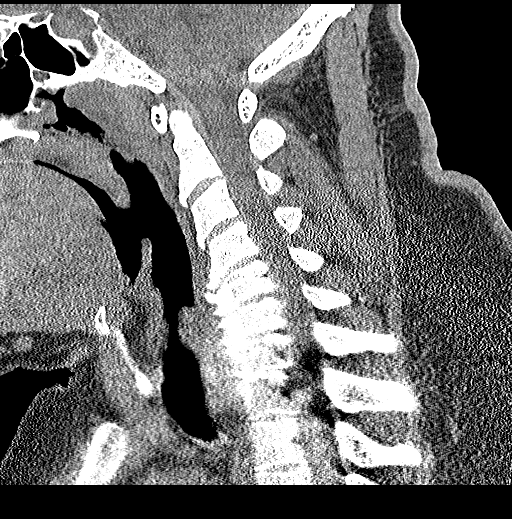
[im 45/89  bone]
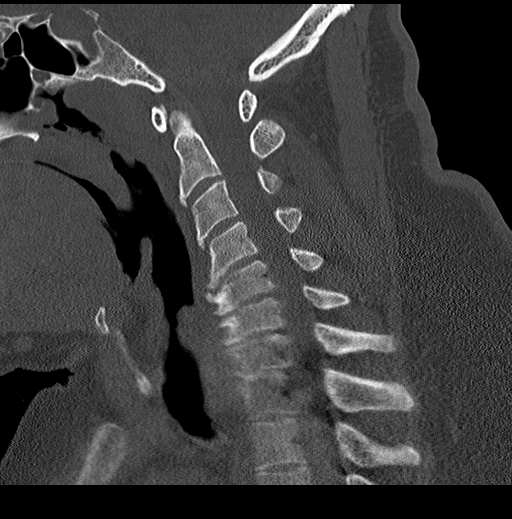
[im 52/89  bone]
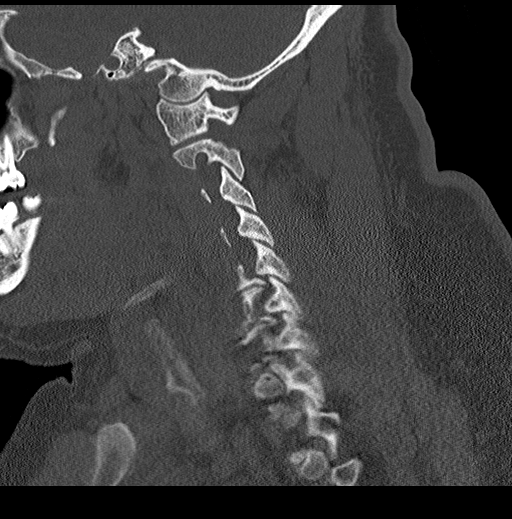
[im 59/89  bone]
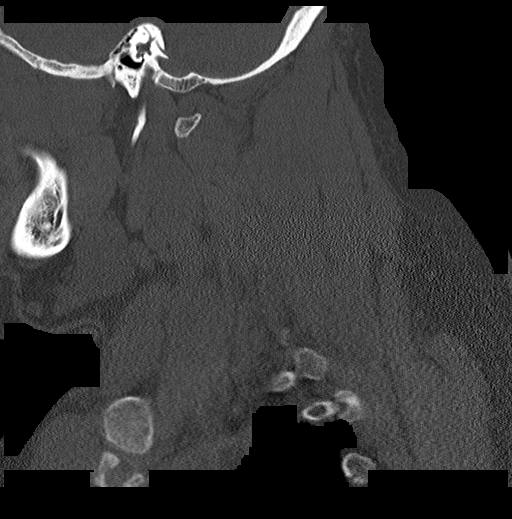

[Series 5: coronal bone · coronal · 0.45mm/px · 3 of 93 slices shown]
[im 24/93  bone]
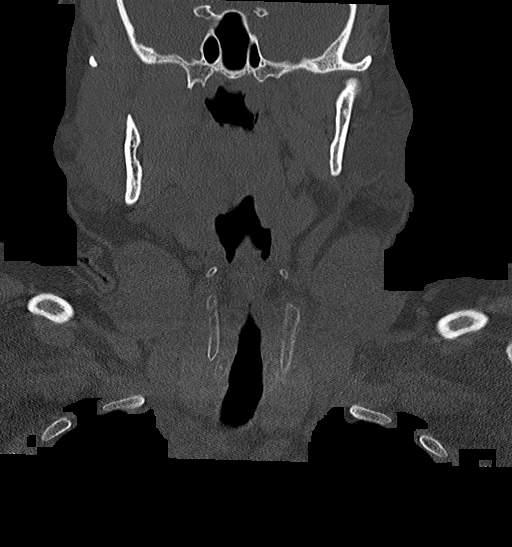
[im 39/93  bone]
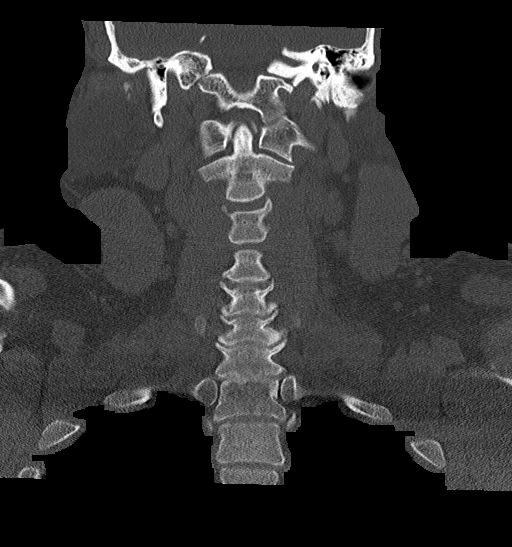
[im 54/93  bone]
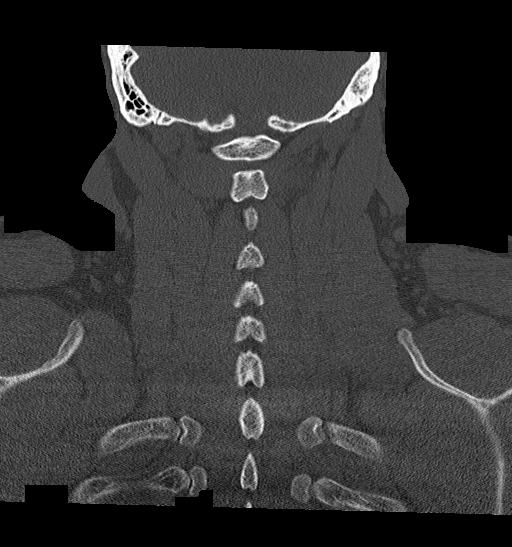

[Series 6: orthogonal axials · axial · 0.37mm/px · z∈[-256,-126]mm · 4 of 105 slices shown, 5 images]
[im 18/105  soft-tissue]
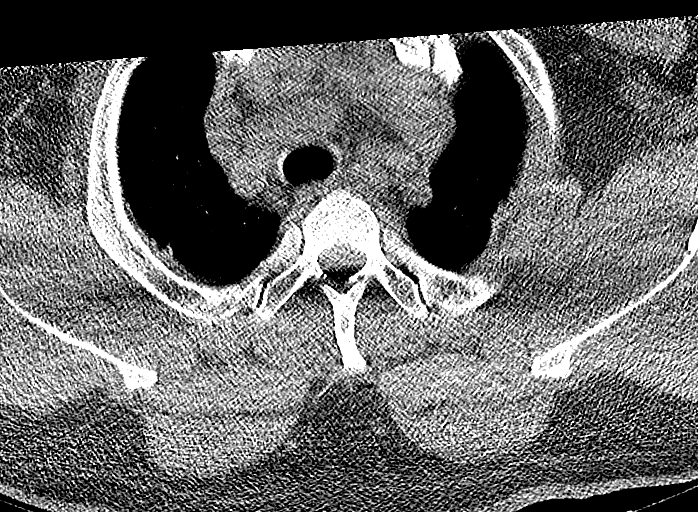
[im 18/105  bone]
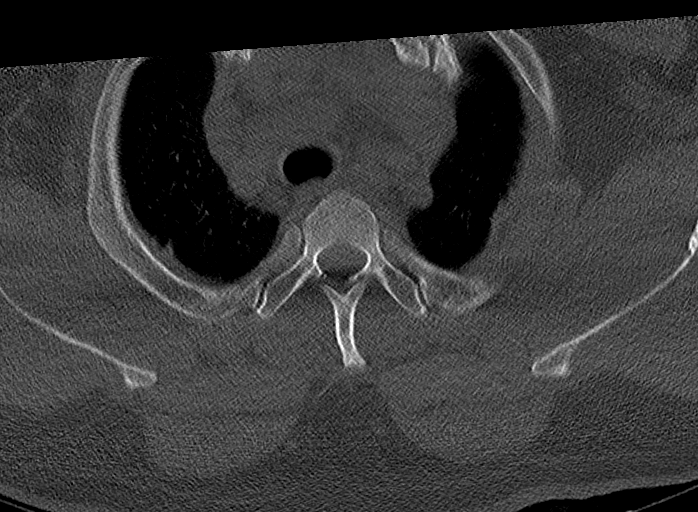
[im 35/105  bone]
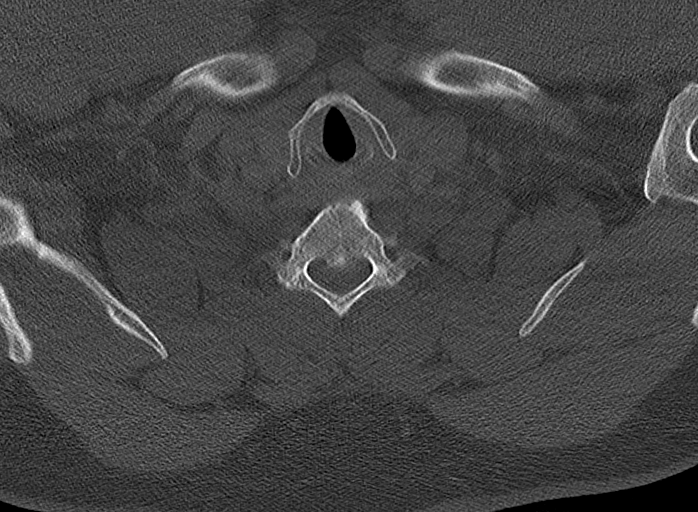
[im 70/105  bone]
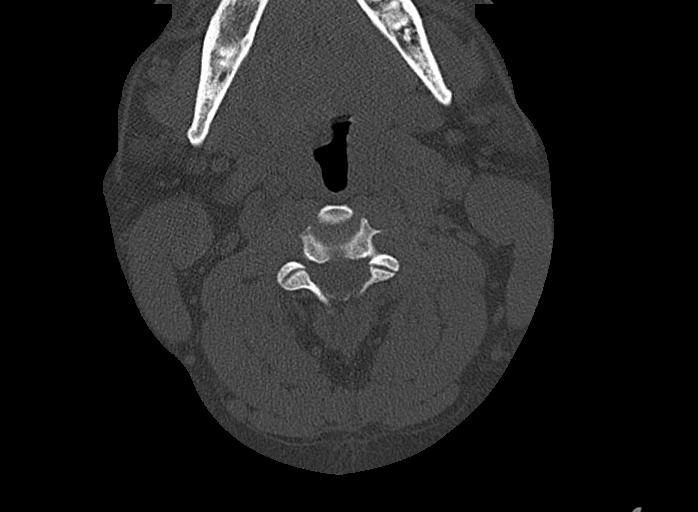
[im 87/105  bone]
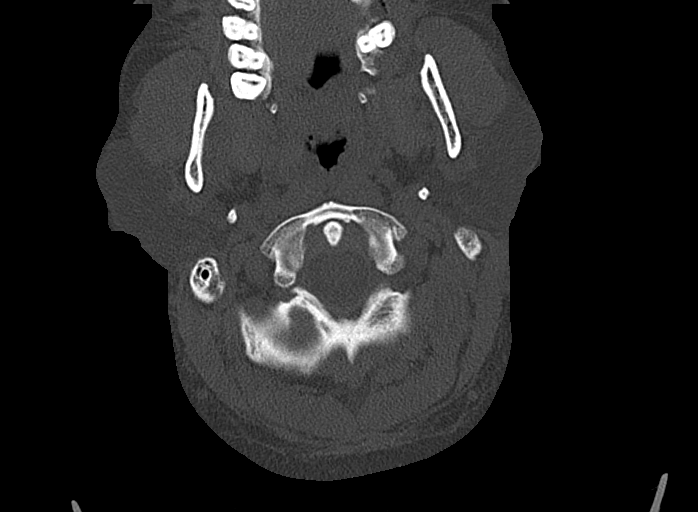

[12 of 33 positions shown; findings below may reference images not displayed]

FINDINGS: CT HEAD FINDINGS

Brain: Normal anatomic configuration. No abnormal intra or
extra-axial mass lesion or fluid collection. No abnormal mass effect
or midline shift. No evidence of acute intracranial hemorrhage or
infarct. Ventricular size is normal. Cerebellum unremarkable.

Vascular: Unremarkable

Skull: Intact

Other: Mastoid air cells and middle ear cavities are clear.

CT MAXILLOFACIAL FINDINGS

Osseous: No fracture or mandibular dislocation. No destructive
process.

Orbits: Negative. No traumatic or inflammatory finding.

Sinuses: There is opacification of a several ethmoid air cells
bilaterally. Remaining paranasal sinuses are clear.

Soft tissues: There is soft tissue swelling superficial to the
frontal bone and nasal bridge with a small superficial laceration
noted at the glabella.

CT CERVICAL SPINE FINDINGS

Alignment: Mild reversal of the normal cervical lordosis. No
listhesis.

Skull base and vertebrae: Craniocervical alignment is normal. The
atlantodental interval is not widened. No acute fracture of the
cervical spine. Vertebral body height is preserved.

Soft tissues and spinal canal: No prevertebral fluid or swelling. No
visible canal hematoma. Posterior disc osteophyte complex at C5-6
results in moderate central canal stenosis with flattening of the
thecal sac. Milder canal stenosis is noted at C6-7 and C4-5
secondary to similar changes.

Disc levels: There is intervertebral disc space narrowing and
endplate remodeling at C3-T1, most severe at C5-T1 in keeping with
changes of mild to moderate degenerative disc disease. The
prevertebral soft tissues are not thickened. Review of the axial
images demonstrates multilevel uncovertebral arthrosis resulting in
multilevel moderate to severe neuroforaminal narrowing, most severe
on the left at C5-6 and C6-7 and on the right at C4-5.

Upper chest: Negative.

Other: None
IMPRESSION: No acute intracranial abnormality.  No calvarial fracture.

No acute facial fracture. Soft tissue swelling superficial to the
frontal bone and nasal bridge with small superficial soft tissue
laceration at the glabella.

No acute fracture or listhesis of the cervical spine.

Multilevel degenerative disc and degenerative joint disease
resulting in moderate central canal stenosis, most severe at C5-6
and multilevel neuroforaminal narrowing as described above.

## 2022-05-10 IMAGING — CR DG CHEST 2V
1 series · 2 of 2 positions shown · non-contrast
Comparison: Chest x-ray 08/08/2013.

CLINICAL DATA: 45-year-old male with history of shortness of
breath. Syncope.

EXAM:
CHEST - 2 VIEW

[Series 1: dg chest 2 view · 0.14mm/px · 2 of 2 slices shown]
[im 1/2]
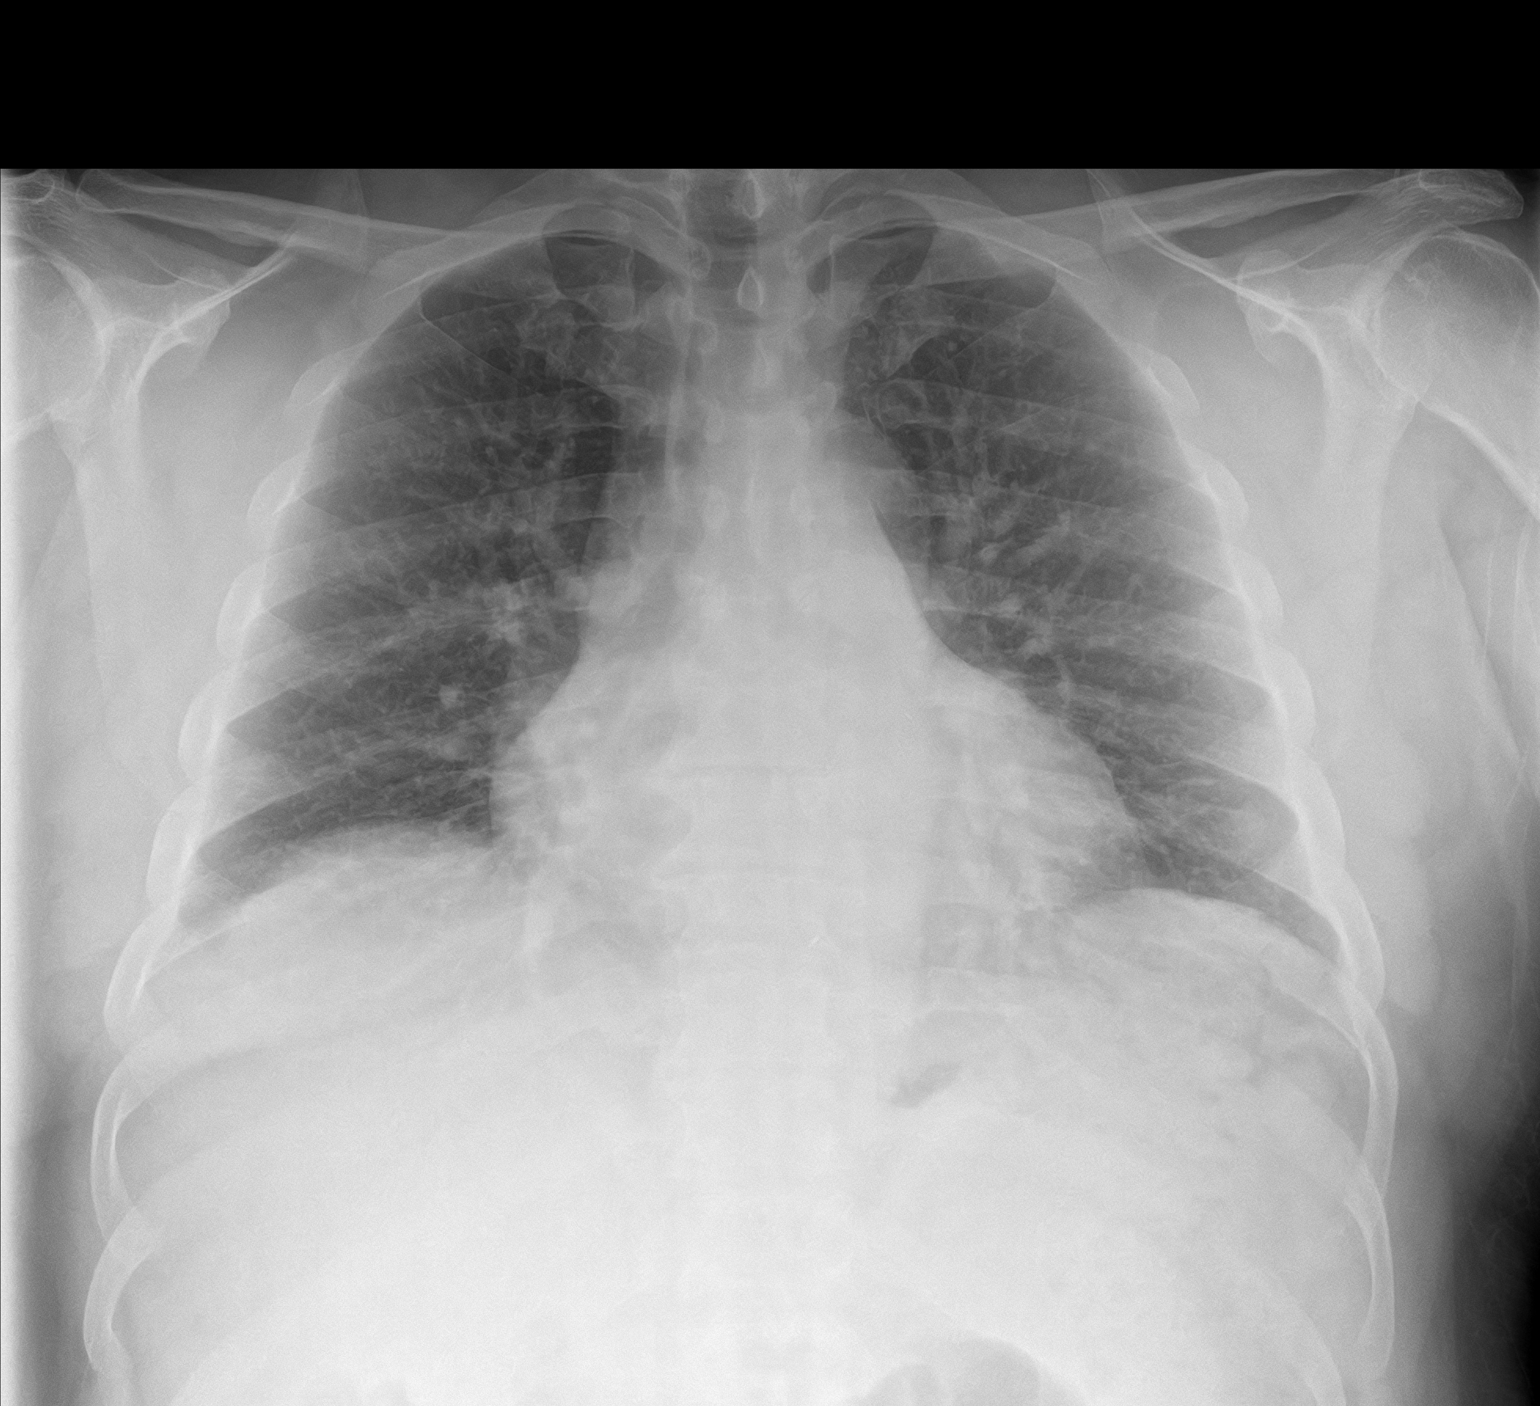
[im 2/2]
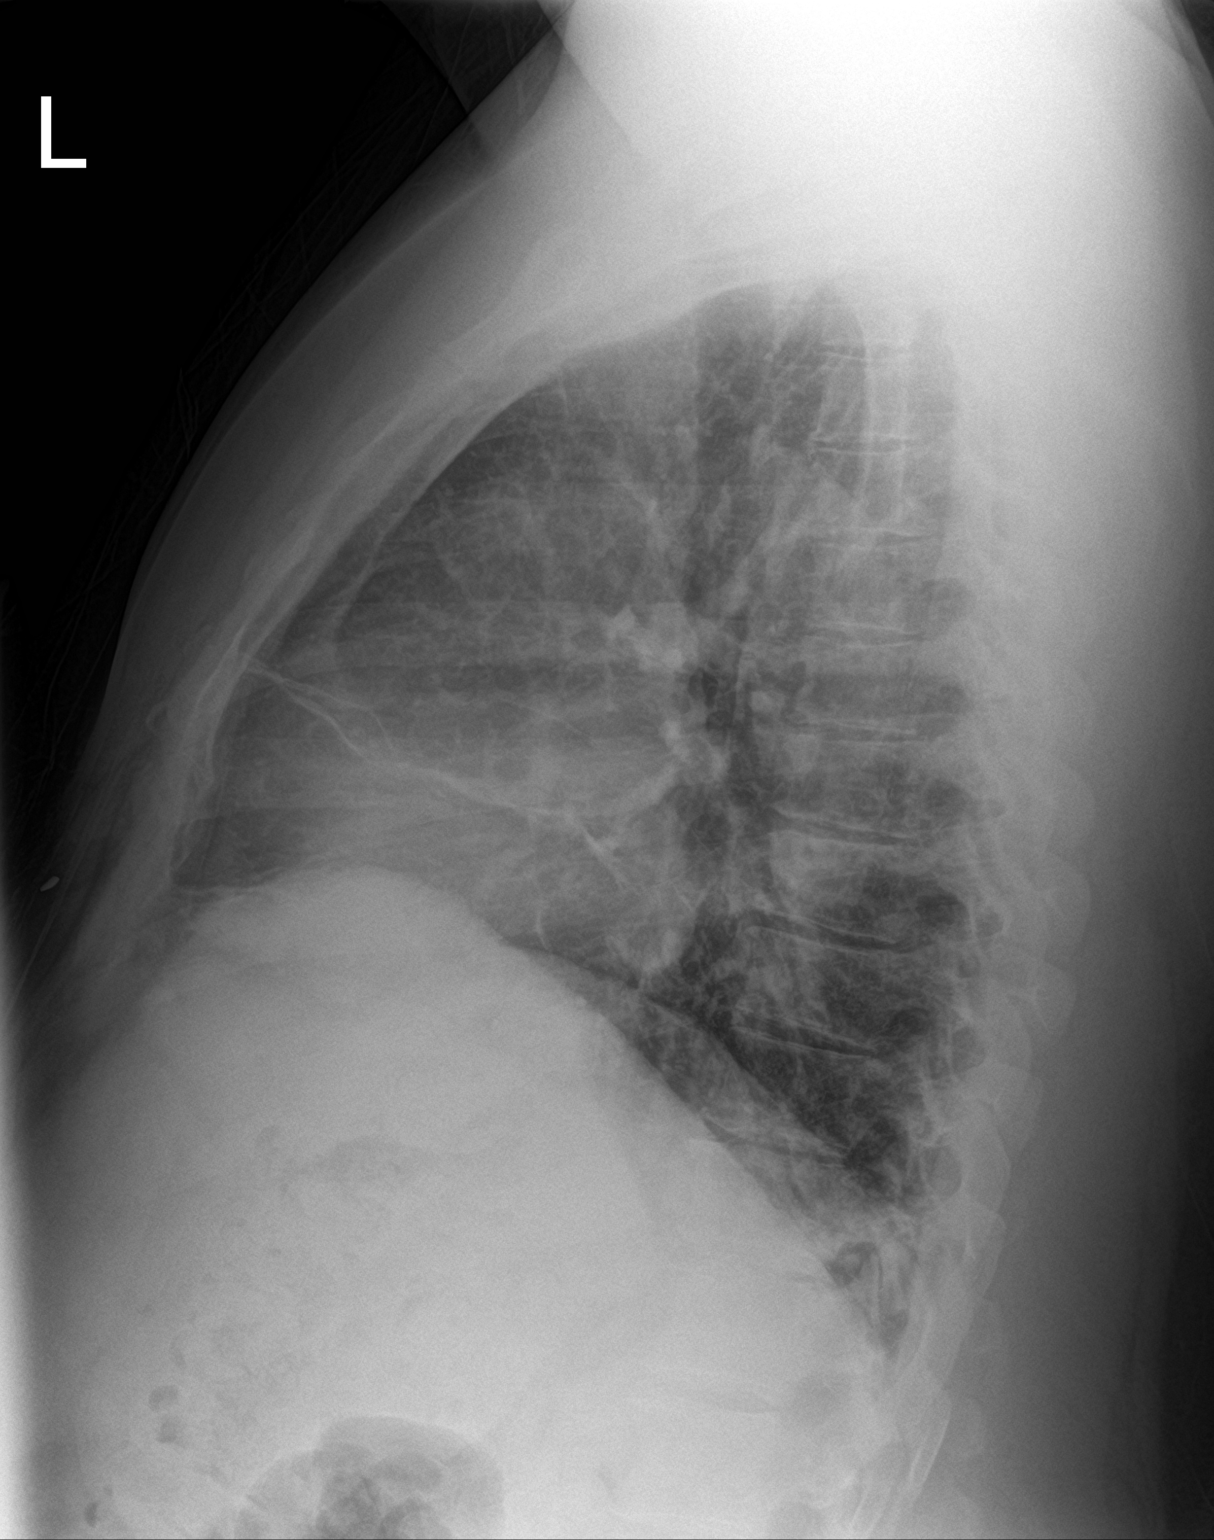

[2 of 2 positions shown; findings below may reference images not displayed]

FINDINGS: Linear opacities are noted in the region of the lingula. Lung
volumes are normal. No consolidative airspace disease. No pleural
effusions. No pneumothorax. No pulmonary nodule or mass noted.
Pulmonary vasculature and the cardiomediastinal silhouette are
within normal limits.
IMPRESSION: 1. New linear areas of scarring or subsegmental atelectasis are
noted in the region of the lingula. Lungs are otherwise clear.

## 2022-08-21 ENCOUNTER — Ambulatory Visit
Admission: EM | Admit: 2022-08-21 | Discharge: 2022-08-21 | Disposition: A | Discharge status: Home / Self Care | Payer: BC Managed Care – PPO | Attending: Emergency Medicine | Admitting: Emergency Medicine

## 2022-08-21 DIAGNOSIS — M25561 Pain in right knee: Secondary | ICD-10-CM | POA: Diagnosis not present

## 2022-08-21 MED ORDER — PREDNISONE 20 MG PO TABS: 40.0000 mg | ORAL_TABLET | Freq: Every day | ORAL | 0 refills | Status: DC

## 2022-08-21 MED ORDER — CYCLOBENZAPRINE HCL 10 MG PO TABS: 10.0000 mg | ORAL_TABLET | Freq: Every day | ORAL | 0 refills | Status: DC

## 2022-08-21 MED ORDER — KETOROLAC TROMETHAMINE 60 MG/2ML IM SOLN
30.0000 mg | Freq: Once | INTRAMUSCULAR | Status: AC
  Administered 2022-08-21: 30 mg via INTRAMUSCULAR

## 2022-08-21 MED ORDER — MELOXICAM 15 MG PO TABS: 15.0000 mg | ORAL_TABLET | Freq: Every day | ORAL | 0 refills | Status: DC | PRN

## 2022-08-21 NOTE — ED Provider Notes (Signed)
[facility]    CSN: [id number] Arrival date & time: 08/21/22  1416      History   Chief Complaint Chief Complaint  Patient presents with   Knee Pain    HPI Jonathan Schwartz is a 46 y.o. male.   Patient presents for anterior right knee pain for 2 weeks. Endorses he turned knee in an awkward positions when getting out of truck and pain has persisted since. Pain does not radiate and is described as sharp and shooting. Has been painful to bear weight and is worsened when a bent position such as when driving. Has attempted use of tylenol which has been ineffective. Known arthritis.    History reviewed. No pertinent past medical history.  There are no problems to display for this patient.   Past Surgical History:  Procedure Laterality Date   ORTHOPEDIC SURGERY         Home Medications    Prior to Admission medications   Medication Sig Start Date End Date Taking? Authorizing Provider  acetaminophen (TYLENOL) 500 MG tablet Take 500 mg by mouth every 6 (six) hours as needed.   Yes [provider]  meloxicam (MOBIC) 15 MG tablet Take 1 tablet (15 mg total) by mouth daily as needed for pain. 07/18/21   Coral Spikes, DO  naproxen (NAPROSYN) 500 MG tablet Take 1 tablet (500 mg total) by mouth 2 (two) times daily. Continue medicine for 2-3 days after left knee pain has resolved 01/14/22   Wynona Luna, MD    Family History History reviewed. No pertinent family history.  Social History Social History   Tobacco Use   Smoking status: Every Day    Packs/day: 0.50    Types: Cigarettes   Smokeless tobacco: Never  Vaping Use   Vaping Use: Never used  Substance Use Topics   Alcohol use: No   Drug use: No     Allergies   Patient has no known allergies.   Review of Systems Review of Systems  Constitutional: Negative.   Respiratory: Negative.    Cardiovascular: Negative.   Skin: Negative.   Neurological: Negative.      Physical  Exam Triage Vital Signs ED Triage Vitals  Enc Vitals Group     BP 08/21/22 1435 128/85     Pulse Rate 08/21/22 1435 83     Resp 08/21/22 1435 16     Temp 08/21/22 1435 98.2 F (36.8 C)     Temp Source 08/21/22 1435 Oral     SpO2 08/21/22 1435 97 %     Weight 08/21/22 1439 267 lb (121.1 kg)     Height 08/21/22 1439 5\' 9"  (1.753 m)     Head Circumference --      Peak Flow --      Pain Score 08/21/22 1439 7     Pain Loc --      Pain Edu? --      Excl. in [place]? --    No data found.  Updated Vital Signs BP 128/85 (BP Location: Left Arm)   Pulse 83   Temp 98.2 F (36.8 C) (Oral)   Resp 16   Ht 5\' 9"  (1.753 m)   Wt 267 lb (121.1 kg)   SpO2 97%   BMI 39.43 kg/m   Visual Acuity Right Eye Distance:   Left Eye Distance:   Bilateral Distance:    Right Eye Near:   Left Eye Near:    Bilateral Near:  Physical Exam Constitutional:      Appearance: Normal appearance.  HENT:     Head: Normocephalic.  Eyes:     Extraocular Movements: Extraocular movements intact.  Pulmonary:     Effort: Pulmonary effort is normal.  Musculoskeletal:     Comments: Tenderness to the anterior knee without point tenderness, ecchymosis, swelling or deformity, able to bear weight, able to complete range of motion, 2 + popliteal pulse,   Neurological:     Mental Status: He is alert and oriented to person, place, and time. Mental status is at baseline.  Psychiatric:        Mood and Affect: Mood normal.        Behavior: Behavior normal.      UC Treatments / Results  Labs (all labs ordered are listed, but only abnormal results are displayed) Labs Reviewed - No data to display  EKG   Radiology No results found.  Procedures Procedures (including critical care time)  Medications Ordered in UC Medications - No data to display  Initial Impression / Assessment and Plan / UC Course  I have reviewed the triage vital signs and the nursing notes.  Pertinent labs & imaging results that were  available during my care of the patient were reviewed by me and considered in my medical decision making (see chart for details).  Acute right knee pain   Low suspicion for bone involvement, will defer injury, denies injury. Torodol injection given, prescribed prednisone and flexeril outpatient, may continue use of knee brace support, recommended RICE and ortho follow up if symptoms present, work note given  Final Clinical Impressions(s) / UC Diagnoses   Final diagnoses:  None   Discharge Instructions   None    ED Prescriptions   None    PDMP not reviewed this encounter.   Valinda Hoar, NP 08/21/22 [phone]

## 2022-08-21 NOTE — Discharge Instructions (Signed)
Your pain is most likely caused by irritation to arthritis which is an inflammatory process of the joints  Today you have been given an injection of Toradol in office this medicine helps to reduce inflammation and ideally will begin to turn your pain down in the next 30 minutes  Starting tomorrow take prednisone every morning with food for 5 days to continue the process above, may use Tylenol while using this medicine  After completion of steroids you may use meloxicam every morning as needed when pain is flared  You may use muscle relaxer at bedtime as needed for additional pain, be mindful this medication may make you drowsy   You may use heating pad in 15 minute intervals as needed for additional comfort, or you may find comfort in using ice in 10-15 minutes over affected area  Begin stretching affected area daily for 10 minutes as tolerated to further loosen muscles   When lying down place pillow underneath and between knees for support   If pain persist after recommended treatment or reoccurs if may be beneficial to follow up with orthopedic specialist for evaluation, this doctor specializes in the bones and can manage your symptoms long-term with options such as but not limited to imaging, medications or physical therapy

## 2022-08-21 NOTE — ED Triage Notes (Signed)
Patient having pain in the right knee. Onset 2 weeks ago Patient turned awkwardly when getting out of his work [employer]. States he was told he was prone to enveloping arthritis in the right knee.   Patient states he is starting to have trouble driving his work vehicle due to bending the right knee.

## 2023-01-26 ENCOUNTER — Encounter: Payer: Self-pay | Admitting: Emergency Medicine

## 2023-01-26 ENCOUNTER — Ambulatory Visit
Admission: EM | Admit: 2023-01-26 | Discharge: 2023-01-26 | Disposition: A | Discharge status: Home / Self Care | Payer: BC Managed Care – PPO | Attending: Emergency Medicine | Admitting: Emergency Medicine

## 2023-01-26 DIAGNOSIS — J069 Acute upper respiratory infection, unspecified: Secondary | ICD-10-CM | POA: Diagnosis not present

## 2023-01-26 DIAGNOSIS — F17210 Nicotine dependence, cigarettes, uncomplicated: Secondary | ICD-10-CM | POA: Diagnosis not present

## 2023-01-26 DIAGNOSIS — Z1152 Encounter for screening for COVID-19: Secondary | ICD-10-CM | POA: Insufficient documentation

## 2023-01-26 DIAGNOSIS — R058 Other specified cough: Secondary | ICD-10-CM | POA: Diagnosis not present

## 2023-01-26 LAB — SARS CORONAVIRUS 2 BY RT PCR: SARS Coronavirus 2 by RT PCR: NEGATIVE

## 2023-01-26 MED ORDER — ONDANSETRON 4 MG PO TBDP: 4.0000 mg | ORAL_TABLET | Freq: Three times a day (TID) | ORAL | 0 refills | Status: DC | PRN

## 2023-01-26 NOTE — Discharge Instructions (Addendum)
Your symptoms today are most likely being caused by a virus and should steadily improve in time it can take up to 7 to 10 days before you truly start to see a turnaround however things will get better  Covid test in negative     You can take Tylenol and/or Ibuprofen as needed for fever reduction and pain relief.   For cough: honey 1/2 to 1 teaspoon (you can dilute the honey in water or another fluid).  You can also use guaifenesin and dextromethorphan for cough. You can use a humidifier for chest congestion and cough.  If you don't have a humidifier, you can sit in the bathroom with the hot shower running.      For sore throat: try warm salt water gargles, cepacol lozenges, throat spray, warm tea or water with lemon/honey, popsicles or ice, or OTC cold relief medicine for throat discomfort.   For congestion: take a daily anti-histamine like Zyrtec, Claritin, and a oral decongestant, such as pseudoephedrine.  You can also use Flonase 1-2 sprays in each nostril daily.   It is important to stay hydrated: drink plenty of fluids (water, gatorade/powerade/pedialyte, juices, or teas) to keep your throat moisturized and help further relieve irritation/discomfort.

## 2023-01-26 NOTE — ED Triage Notes (Signed)
Pt was exposed to covid about 3 days ago. He states he has headache, sore throat, shortness of breath, runny nose, and nausea. Started about 2 days ago.

## 2023-01-26 NOTE — ED Provider Notes (Signed)
[facility]    CSN: [id number] Arrival date & time: 01/26/23  1310      History   Chief Complaint Chief Complaint  Patient presents with   Covid Exposure   Sore Throat    HPI Jonathan Schwartz is a 47 y.o. male.   Patient presents for evaluation of chills, nasal congestion, rhinorrhea, sore throat, cough, intermittent posterior headaches and vomiting present for 3 days.  Last occurrence of vomiting this morning, has been able to tolerate food and liquids since, vomiting has caused further throat irritation due to the accident.  Cough is nonproductive, denies shortness of breath or wheezing.  Known exposure to COVID 3 days ago.  Has attempted use of Tylenol and over-the-counter cold and flu Maczis which have been somewhat helpful.  Denies respiratory history, non-smoker.    History reviewed. No pertinent past medical history.  There are no problems to display for this patient.   Past Surgical History:  Procedure Laterality Date   ORTHOPEDIC SURGERY         Home Medications    Prior to Admission medications   Medication Sig Start Date End Date Taking? Authorizing Provider  acetaminophen (TYLENOL) 500 MG tablet Take 500 mg by mouth every 6 (six) hours as needed.    [provider]  cyclobenzaprine (FLEXERIL) 10 MG tablet Take 1 tablet (10 mg total) by mouth at bedtime. 08/21/22   Sharicka Pogorzelski, Leitha Schuller, NP  meloxicam (MOBIC) 15 MG tablet Take 1 tablet (15 mg total) by mouth daily as needed for pain. 08/21/22   Iris Tatsch, Leitha Schuller, NP  naproxen (NAPROSYN) 500 MG tablet Take 1 tablet (500 mg total) by mouth 2 (two) times daily. Continue medicine for 2-3 days after left knee pain has resolved 01/14/22   Wynona Luna, MD  predniSONE (DELTASONE) 20 MG tablet Take 2 tablets (40 mg total) by mouth daily. 08/21/22   Hans Eden, NP    Family History No family history on file.  Social History Social History   Tobacco Use   Smoking status: Every  Day    Packs/day: 0.50    Types: Cigarettes   Smokeless tobacco: Never  Vaping Use   Vaping Use: Never used  Substance Use Topics   Alcohol use: No   Drug use: No     Allergies   Patient has no known allergies.   Review of Systems Review of Systems  Constitutional:  Positive for chills. Negative for activity change, appetite change, diaphoresis, fatigue, fever and unexpected weight change.  HENT:  Positive for congestion, rhinorrhea and sore throat. Negative for dental problem, drooling, ear discharge, ear pain, facial swelling, hearing loss, mouth sores, nosebleeds, postnasal drip, sinus pressure, sinus pain, sneezing, tinnitus, trouble swallowing and voice change.   Respiratory:  Positive for cough. Negative for apnea, choking, chest tightness, shortness of breath, wheezing and stridor.   Cardiovascular: Negative.   Gastrointestinal:  Positive for vomiting. Negative for abdominal distention, abdominal pain, anal bleeding, blood in stool, constipation, diarrhea, nausea and rectal pain.  Skin: Negative.   Neurological:  Positive for headaches. Negative for dizziness, tremors, seizures, syncope, facial asymmetry, speech difficulty, weakness, light-headedness and numbness.     Physical Exam Triage Vital Signs ED Triage Vitals  Enc Vitals Group     BP 01/26/23 1422 (!) 140/90     Pulse Rate 01/26/23 1422 97     Resp 01/26/23 1422 16     Temp 01/26/23 1422 98.1 F (36.7 C)  Temp Source 01/26/23 1422 Oral     SpO2 01/26/23 1422 96 %     Weight 01/26/23 1421 266 lb 15.6 oz (121.1 kg)     Height 01/26/23 1421 '5\' 9"'$  (1.753 m)     Head Circumference --      Peak Flow --      Pain Score 01/26/23 1420 5     Pain Loc --      Pain Edu? --      Excl. in [place]? --    No data found.  Updated Vital Signs BP (!) 140/90 (BP Location: Right Arm)   Pulse 97   Temp 98.1 F (36.7 C) (Oral)   Resp 16   Ht '5\' 9"'$  (1.753 m)   Wt 266 lb 15.6 oz (121.1 kg)   SpO2 96%   BMI 39.43 kg/m    Visual Acuity Right Eye Distance:   Left Eye Distance:   Bilateral Distance:    Right Eye Near:   Left Eye Near:    Bilateral Near:     Physical Exam Constitutional:      Appearance: Normal appearance. He is well-developed.  HENT:     Head: Normocephalic.     Right Ear: Tympanic membrane, ear canal and external ear normal.     Left Ear: Tympanic membrane, ear canal and external ear normal.     Nose: Congestion and rhinorrhea present.     Mouth/Throat:     Pharynx: No posterior oropharyngeal erythema.     Tonsils: No tonsillar exudate. 0 on the right. 0 on the left.  Eyes:     Extraocular Movements: Extraocular movements intact.  Cardiovascular:     Rate and Rhythm: Normal rate and regular rhythm.     Pulses: Normal pulses.     Heart sounds: Normal heart sounds.  Pulmonary:     Effort: Pulmonary effort is normal.     Breath sounds: Normal breath sounds.  Skin:    General: Skin is warm and dry.  Neurological:     Mental Status: He is alert and oriented to person, place, and time. Mental status is at baseline.      UC Treatments / Results  Labs (all labs ordered are listed, but only abnormal results are displayed) Labs Reviewed  SARS CORONAVIRUS 2 BY RT PCR    EKG   Radiology No results found.  Procedures Procedures (including critical care time)  Medications Ordered in UC Medications - No data to display  Initial Impression / Assessment and Plan / UC Course  I have reviewed the triage vital signs and the nursing notes.  Pertinent labs & imaging results that were available during my care of the patient were reviewed by me and considered in my medical decision making (see chart for details).  Viral URI with cough   Patient is in no signs of distress nor toxic appearing.  Vital signs are stable.  Low suspicion for pneumonia, pneumothorax or bronchitis and therefore will defer imaging. Covid negative. Prescribed zofran. May use additional over-the-counter  medications as needed for supportive care.  May follow-up with urgent care as needed if symptoms persist or worsen.  Note given.    Final Clinical Impressions(s) / UC Diagnoses   Final diagnoses:  None   Discharge Instructions   None    ED Prescriptions   None    PDMP not reviewed this encounter.   Hans Eden, NP 01/26/23 1527

## 2023-08-05 ENCOUNTER — Other Ambulatory Visit: Payer: Self-pay

## 2023-08-05 ENCOUNTER — Ambulatory Visit (INDEPENDENT_AMBULATORY_CARE_PROVIDER_SITE_OTHER): Payer: BC Managed Care – PPO

## 2023-08-05 ENCOUNTER — Ambulatory Visit
Admission: EM | Admit: 2023-08-05 | Discharge: 2023-08-05 | Disposition: A | Discharge status: Home / Self Care | Payer: BC Managed Care – PPO | Attending: Family Medicine | Admitting: Family Medicine

## 2023-08-05 DIAGNOSIS — M25562 Pain in left knee: Secondary | ICD-10-CM | POA: Diagnosis not present

## 2023-08-05 DIAGNOSIS — M1712 Unilateral primary osteoarthritis, left knee: Secondary | ICD-10-CM | POA: Diagnosis not present

## 2023-08-05 DIAGNOSIS — M25561 Pain in right knee: Secondary | ICD-10-CM

## 2023-08-05 DIAGNOSIS — M1711 Unilateral primary osteoarthritis, right knee: Secondary | ICD-10-CM

## 2023-08-05 DIAGNOSIS — M172 Bilateral post-traumatic osteoarthritis of knee: Secondary | ICD-10-CM | POA: Diagnosis not present

## 2023-08-05 HISTORY — DX: Unspecified osteoarthritis, unspecified site: M19.90

## 2023-08-05 MED ORDER — KETOROLAC TROMETHAMINE 60 MG/2ML IM SOLN
30.0000 mg | Freq: Once | INTRAMUSCULAR | Status: AC
  Administered 2023-08-05: 30 mg via INTRAMUSCULAR

## 2023-08-05 MED ORDER — CYCLOBENZAPRINE HCL 10 MG PO TABS: 10.0000 mg | ORAL_TABLET | Freq: Two times a day (BID) | ORAL | 0 refills | Status: DC | PRN

## 2023-08-05 MED ORDER — TRAMADOL HCL 50 MG PO TABS
50.0000 mg | ORAL_TABLET | Freq: Four times a day (QID) | ORAL | 0 refills | Status: DC | PRN
Start: 2023-08-05 — End: 2023-12-28

## 2023-08-05 MED ORDER — NAPROXEN 500 MG PO TABS: 500.0000 mg | ORAL_TABLET | Freq: Two times a day (BID) | ORAL | 0 refills | Status: DC

## 2023-08-05 NOTE — ED Provider Notes (Signed)
[facility]    CSN: [id number] Arrival date & time: 08/05/23  1154      History   Chief Complaint Chief Complaint  Patient presents with   Knee Pain    HPI  HPI Jonathan Schwartz is a 47 y.o. male.   Jonathan Schwartz presents for bilateral knee pain that started 4 months ago.  Pain 9.5/10 then hears a pop and gets some relief.  Right knee pain is worse than left. In the middle of the night he screamed as the pain woke him and his wife up from sleep. Pain described as burning and "plank board stiffness."  He hears a pop in the bad of his leg. He played sports when he was younger.   Took OTC meds which give some short-term relief but doesn't take the pain away.  Feels like he "tweaked it a lil bit". Wears knee compression sleeves for support.       Past Medical History:  Diagnosis Date   Arthritis     There are no problems to display for this patient.   Past Surgical History:  Procedure Laterality Date   ORTHOPEDIC SURGERY         Home Medications    Prior to Admission medications   Medication Sig Start Date End Date Taking? Authorizing Provider  acetaminophen (TYLENOL) 500 MG tablet Take 500 mg by mouth every 6 (six) hours as needed.   Yes [provider]  cyclobenzaprine (FLEXERIL) 10 MG tablet Take 1 tablet (10 mg total) by mouth 2 (two) times daily as needed for muscle spasms. 08/05/23  Yes Brittie Whisnant, DO  meloxicam (MOBIC) 15 MG tablet Take 1 tablet (15 mg total) by mouth daily as needed for pain. 08/21/22  Yes White, Elita Boone, NP  naproxen (NAPROSYN) 500 MG tablet Take 1 tablet (500 mg total) by mouth 2 (two) times daily with a meal. 08/05/23  Yes Lindie Roberson, DO  ondansetron (ZOFRAN-ODT) 4 MG disintegrating tablet Take 1 tablet (4 mg total) by mouth every 8 (eight) hours as needed for nausea or vomiting. 01/26/23  Yes White, Adrienne R, NP  predniSONE (DELTASONE) 20 MG tablet Take 2 tablets (40 mg total) by mouth daily. 08/21/22  Yes  White, Elita Boone, NP  traMADol (ULTRAM) 50 MG tablet Take 1 tablet (50 mg total) by mouth every 6 (six) hours as needed. 08/05/23  Yes Pearson Reasons, Seward Meth, DO    Family History No family history on file.  Social History Social History   Tobacco Use   Smoking status: Every Day    Current packs/day: 0.50    Types: Cigarettes   Smokeless tobacco: Never  Vaping Use   Vaping status: Never Used  Substance Use Topics   Alcohol use: No   Drug use: No     Allergies   Patient has no known allergies.   Review of Systems Review of Systems: :negative unless otherwise stated in HPI.      Physical Exam Triage Vital Signs ED Triage Vitals  Encounter Vitals Group     BP 08/05/23 1232 (!) 148/96     Systolic BP Percentile --      Diastolic BP Percentile --      Pulse Rate 08/05/23 1232 80     Resp 08/05/23 1232 20     Temp 08/05/23 1233 98.2 F (36.8 C)     Temp src --      SpO2 08/05/23 1232 100 %     Weight --  Height --      Head Circumference --      Peak Flow --      Pain Score 08/05/23 1229 10     Pain Loc --      Pain Education --      Exclude from Growth Chart --    No data found.  Updated Vital Signs BP (!) 148/96   Pulse 80   Temp 98.2 F (36.8 C)   Resp 20   SpO2 100%   Visual Acuity Right Eye Distance:   Left Eye Distance:   Bilateral Distance:    Right Eye Near:   Left Eye Near:    Bilateral Near:     Physical Exam GEN: well appearing male in no acute distress  CVS: well perfused  RESP: speaking in full sentences without pause, no respiratory distress  MSK: Bilateral Knee Exam -Inspection: no deformity, no discoloration, no visible edema  -Palpation: medial and lateral joint line tenderness bilaterally with anterior TTP on left and posterior and anterior TTP on right -ROM: Extension: 0 degrees; Flexion:~ 100 degrees -Special Tests: Varus Stress: Negative; Valgus Stress: Negative; Anterior: Negative; Posterior drawer: Negative; McMurray:  positive; Thessaly: not attempted; Patellar grind: positive bilaterally  -Limb neurovascularly intact, no instability noted    UC Treatments / Results  Labs (all labs ordered are listed, but only abnormal results are displayed) Labs Reviewed - No data to display  EKG   Radiology DG Knee Complete 4 Views Left  Result Date: 08/05/2023 CLINICAL DATA:  anterior knee pain; posterior and lateral knee pain EXAM: LEFT KNEE - COMPLETE 4+ VIEW; RIGHT KNEE - COMPLETE 4+ VIEW COMPARISON:  None Available. FINDINGS: RIGHT knee: No acute fracture or dislocation. Mild to moderate tricompartmental joint space narrowing and osteophyte formation. There is a tiny enthesophyte of the quadriceps tendon insertion. No area of erosion or osseous destruction. No unexpected radiopaque foreign body. Soft tissues are unremarkable. LEFT knee: No acute fracture or dislocation. Moderate tricompartmental joint space narrowing and osteophyte formation. Enthesophytes of the patellar tendon insertion on the tibia as well as the quadriceps tendon insertion on the patella. No area of erosion or osseous destruction. No unexpected radiopaque foreign body. Soft tissues are unremarkable. IMPRESSION: Mild to moderate degenerative changes of the bilateral knees, LEFT greater than RIGHT. Electronically Signed   By: Meda Klinefelter M.D.   On: 08/05/2023 14:08   DG Knee Complete 4 Views Right  Result Date: 08/05/2023 CLINICAL DATA:  anterior knee pain; posterior and lateral knee pain EXAM: LEFT KNEE - COMPLETE 4+ VIEW; RIGHT KNEE - COMPLETE 4+ VIEW COMPARISON:  None Available. FINDINGS: RIGHT knee: No acute fracture or dislocation. Mild to moderate tricompartmental joint space narrowing and osteophyte formation. There is a tiny enthesophyte of the quadriceps tendon insertion. No area of erosion or osseous destruction. No unexpected radiopaque foreign body. Soft tissues are unremarkable. LEFT knee: No acute fracture or dislocation. Moderate  tricompartmental joint space narrowing and osteophyte formation. Enthesophytes of the patellar tendon insertion on the tibia as well as the quadriceps tendon insertion on the patella. No area of erosion or osseous destruction. No unexpected radiopaque foreign body. Soft tissues are unremarkable. IMPRESSION: Mild to moderate degenerative changes of the bilateral knees, LEFT greater than RIGHT. Electronically Signed   By: Meda Klinefelter M.D.   On: 08/05/2023 14:08     Procedures Procedures (including critical care time)  Medications Ordered in UC Medications  ketorolac (TORADOL) injection 30 mg (30 mg Intramuscular Given 08/05/23 1412)  Initial Impression / Assessment and Plan / UC Course  I have reviewed the triage vital signs and the nursing notes.  Pertinent labs & imaging results that were available during my care of the patient were reviewed by me and considered in my medical decision making (see chart for details).      Pt is a 47 y.o.  male with acute on chronic bilateral knee pain.   On exam, pt has diffuse tenderness on the right and joint line and anterior tenderness on the left.  On chart review, he did have a left knee x-ray at [facility] in March 2022 but I do not see the result available.  Obtained bilateral knee plain films.  Personally interpreted by me were unremarkable for fracture or dislocation. Radiologist report reviewed and additionally notes mild to moderate degenerative changes of bilateral knees left greater than right.  He was given Toradol 30 mg IM with some relief.  He has his own knee braces.  Patient to gradually return to normal activities, as tolerated and continue ordinary activities within the limits permitted by pain. Prescribed Naproxen sodium, tramadol and muscle relaxer (Flexeril) for pain relief.  Tylenol PRN. Advised patient to avoid OTC NSAIDs while taking prescription NSAID. Counseled patient on red flag symptoms and when to seek immediate care.     Patient to follow up with orthopedic provider, if symptoms do not improve with conservative treatment.  Return and ED precautions given. Understanding voiced. Discussed MDM, treatment plan and plan for follow-up with patient who agrees with plan.   Final Clinical Impressions(s) / UC Diagnoses   Final diagnoses:  Primary osteoarthritis of left knee  Primary osteoarthritis of right knee     Discharge Instructions      Stop by the pharmacy to pick up your prescriptions.  Tramadol may make you sleepy so do not drive or operate heavy machinery with taking this medication.  For your  pain, Take 1500 mg Tylenol twice a day, take muscle relaxer (Flexeril) twice a day, take Naprosyn twice a day,  as needed for pain.  Tramadol for severe pain.  Apply a compressive ACE bandage. Rest and elevate the affected painful area.  Apply cold compresses intermittently, as needed.  As pain recedes, begin normal activities slowly as tolerated.  Follow up with primary care provider or an orthopedic provider, if symptoms persist.  Watch for worsening symptoms such as an increasing weakness or loss of sensation, increasing pain and/or the loss of bladder or bowel function. Should any of these occur, go to the emergency department immediately.        ED Prescriptions     Medication Sig Dispense Auth. Provider   naproxen (NAPROSYN) 500 MG tablet Take 1 tablet (500 mg total) by mouth 2 (two) times daily with a meal. 30 tablet Ari Engelbrecht, DO   traMADol (ULTRAM) 50 MG tablet Take 1 tablet (50 mg total) by mouth every 6 (six) hours as needed. 15 tablet Rodarius Kichline, DO   cyclobenzaprine (FLEXERIL) 10 MG tablet Take 1 tablet (10 mg total) by mouth 2 (two) times daily as needed for muscle spasms. 20 tablet Forbes Loll, Seward Meth, DO      I have reviewed the PDMP during this encounter.   Katha Cabal, DO 08/07/23 [phone]

## 2023-08-05 NOTE — ED Triage Notes (Signed)
Bilat knee pain but right knee locked up last night and pt was in extreme pain. Has hx of arthritis in both knees.

## 2023-08-05 NOTE — Discharge Instructions (Addendum)
Stop by the pharmacy to pick up your prescriptions.  Tramadol may make you sleepy so do not drive or operate heavy machinery with taking this medication.  For your  pain, Take 1500 mg Tylenol twice a day, take muscle relaxer (Flexeril) twice a day, take Naprosyn twice a day,  as needed for pain.  Tramadol for severe pain.  Apply a compressive ACE bandage. Rest and elevate the affected painful area.  Apply cold compresses intermittently, as needed.  As pain recedes, begin normal activities slowly as tolerated.  Follow up with primary care provider or an orthopedic provider, if symptoms persist.  Watch for worsening symptoms such as an increasing weakness or loss of sensation, increasing pain and/or the loss of bladder or bowel function. Should any of these occur, go to the emergency department immediately.

## 2023-11-20 ENCOUNTER — Encounter: Payer: Self-pay | Admitting: Emergency Medicine

## 2023-11-20 ENCOUNTER — Ambulatory Visit
Admission: EM | Admit: 2023-11-20 | Discharge: 2023-11-20 | Disposition: A | Discharge status: Home / Self Care | Payer: BC Managed Care – PPO | Attending: Physician Assistant | Admitting: Physician Assistant

## 2023-11-20 DIAGNOSIS — R319 Hematuria, unspecified: Secondary | ICD-10-CM | POA: Diagnosis present

## 2023-11-20 DIAGNOSIS — F17200 Nicotine dependence, unspecified, uncomplicated: Secondary | ICD-10-CM

## 2023-11-20 DIAGNOSIS — Z758 Other problems related to medical facilities and other health care: Secondary | ICD-10-CM | POA: Diagnosis present

## 2023-11-20 DIAGNOSIS — K5900 Constipation, unspecified: Secondary | ICD-10-CM

## 2023-11-20 DIAGNOSIS — N410 Acute prostatitis: Secondary | ICD-10-CM | POA: Diagnosis present

## 2023-11-20 LAB — URINALYSIS, W/ REFLEX TO CULTURE (INFECTION SUSPECTED)
Bilirubin Urine: NEGATIVE
Glucose, UA: NEGATIVE mg/dL
Hgb urine dipstick: NEGATIVE
Ketones, ur: NEGATIVE mg/dL
Nitrite: NEGATIVE
Protein, ur: NEGATIVE mg/dL
Specific Gravity, Urine: 1.015 (ref 1.005–1.030)
pH: 7 (ref 5.0–8.0)

## 2023-11-20 MED ORDER — SULFAMETHOXAZOLE-TRIMETHOPRIM 800-160 MG PO TABS: 1.0000 | ORAL_TABLET | Freq: Two times a day (BID) | ORAL | 0 refills | Status: AC

## 2023-11-20 NOTE — Discharge Instructions (Addendum)
 We are going to treat you for prostatitis, take antibiotic as directed, drink plenty of water, take stool softener to help with constipation, avoid straining.  Please keep appointment February 10 to get established with PCP, most likely will need referral to urology and GI for colonoscopy.  If you develop new or worsening symptoms go to the emergency room for further evaluation(chest pain,shortness of breath, abdominal pain, rectal pain, bleeding, etc).

## 2023-11-20 NOTE — ED Triage Notes (Signed)
 Pt c/o decreased in urine stream, rectal bleeding,rectal pain. He states his symptoms have been intermittent over the last couple of months.He states he does not have a PCP. He is concerned about his prostate.

## 2023-11-20 NOTE — ED Provider Notes (Signed)
 [facility]    CSN: [id number] Arrival date & time: 11/20/23  0940      History   Chief Complaint Chief Complaint  Patient presents with   urinary problems   Rectal Bleeding   Rectal Pain    HPI Farhad Burleson Stanke is a 48 y.o. male.   48 year old male patient, Oakes Mccready, presents to urgent care for evaluation of decreased urine stream, rectal bleeding after straining ,rectal pain with bowel movement.  Patient denies abdominal pain or fever, patient states his symptoms have been intermittent over the last few months, he does not have a PCP and is concerned about prostate problems.  Patient endorse smoking, no alcohol or drug use  The history is provided by the patient. No language interpreter was used.    Past Medical History:  Diagnosis Date   Arthritis     Patient Active Problem List   Diagnosis Date Noted   Hematuria 11/20/2023   Does not have primary care provider 11/20/2023   Smoker 11/20/2023   Constipation 11/20/2023   Acute prostatitis 11/20/2023    Past Surgical History:  Procedure Laterality Date   FRACTURE SURGERY     ORTHOPEDIC SURGERY         Home Medications    Prior to Admission medications   Medication Sig Start Date End Date Taking? Authorizing Provider  sulfamethoxazole -trimethoprim  (BACTRIM  DS) 800-160 MG tablet Take 1 tablet by mouth 2 (two) times daily for 28 days. 11/20/23 12/18/23 Yes Nathifa Ritthaler, NP  acetaminophen (TYLENOL) 500 MG tablet Take 500 mg by mouth every 6 (six) hours as needed.    [provider]  cyclobenzaprine  (FLEXERIL ) 10 MG tablet Take 1 tablet (10 mg total) by mouth 2 (two) times daily as needed for muscle spasms. 08/05/23   Brimage, Vondra, DO  meloxicam  (MOBIC ) 15 MG tablet Take 1 tablet (15 mg total) by mouth daily as needed for pain. 08/21/22   Teresa Shelba SAUNDERS, NP  naproxen  (NAPROSYN ) 500 MG tablet Take 1 tablet (500 mg total) by mouth 2 (two) times daily with a meal. 08/05/23    Brimage, Caprice, DO  ondansetron  (ZOFRAN -ODT) 4 MG disintegrating tablet Take 1 tablet (4 mg total) by mouth every 8 (eight) hours as needed for nausea or vomiting. 01/26/23   Teresa Shelba SAUNDERS, NP  predniSONE  (DELTASONE ) 20 MG tablet Take 2 tablets (40 mg total) by mouth daily. 08/21/22   White, Shelba SAUNDERS, NP  traMADol  (ULTRAM ) 50 MG tablet Take 1 tablet (50 mg total) by mouth every 6 (six) hours as needed. 08/05/23   Brimage, Vondra, DO    Family History History reviewed. No pertinent family history.  Social History Social History   Tobacco Use   Smoking status: Every Day    Current packs/day: 0.50    Types: Cigarettes   Smokeless tobacco: Never  Vaping Use   Vaping status: Never Used  Substance Use Topics   Alcohol use: No   Drug use: No     Allergies   Patient has no known allergies.   Review of Systems Review of Systems  Constitutional:  Negative for fever.  Gastrointestinal:  Positive for blood in stool, constipation and rectal pain. Negative for abdominal pain and nausea.  Genitourinary:  Positive for difficulty urinating and hematuria.  All other systems reviewed and are negative.    Physical Exam Triage Vital Signs ED Triage Vitals  Encounter Vitals Group     BP 11/20/23 1010 (!) 162/94     Systolic BP  Percentile --      Diastolic BP Percentile --      Pulse Rate 11/20/23 1010 67     Resp 11/20/23 1010 18     Temp 11/20/23 1010 97.8 F (36.6 C)     Temp Source 11/20/23 1010 Oral     SpO2 11/20/23 1010 100 %     Weight 11/20/23 1007 262 lb (118.8 kg)     Height 11/20/23 1007 5' 9 (1.753 m)     Head Circumference --      Peak Flow --      Pain Score 11/20/23 1006 5     Pain Loc --      Pain Education --      Exclude from Growth Chart --    No data found.  Updated Vital Signs BP (!) 162/94 (BP Location: Right Arm)   Pulse 67   Temp 97.8 F (36.6 C) (Oral)   Resp 18   Ht 5' 9 (1.753 m)   Wt 262 lb (118.8 kg)   SpO2 100%   BMI 38.69 kg/m    Visual Acuity Right Eye Distance:   Left Eye Distance:   Bilateral Distance:    Right Eye Near:   Left Eye Near:    Bilateral Near:     Physical Exam Vitals and nursing note reviewed.  Constitutional:      Appearance: Normal appearance. He is well-developed and well-groomed.  Cardiovascular:     Rate and Rhythm: Normal rate.  Pulmonary:     Effort: Pulmonary effort is normal.  Neurological:     General: No focal deficit present.     Mental Status: He is alert and oriented to person, place, and time.     GCS: GCS eye subscore is 4. GCS verbal subscore is 5. GCS motor subscore is 6.  Psychiatric:        Attention and Perception: Attention normal.        Mood and Affect: Mood normal.        Behavior: Behavior is cooperative.      UC Treatments / Results  Labs (all labs ordered are listed, but only abnormal results are displayed) Labs Reviewed  URINALYSIS, W/ REFLEX TO CULTURE (INFECTION SUSPECTED) - Abnormal; Notable for the following components:      Result Value   Leukocytes,Ua TRACE (*)    Bacteria, UA RARE (*)    All other components within normal limits  URINE CULTURE    EKG   Radiology No results found.  Procedures Procedures (including critical care time)  Medications Ordered in UC Medications - No data to display  Initial Impression / Assessment and Plan / UC Course  I have reviewed the triage vital signs and the nursing notes.  Pertinent labs & imaging results that were available during my care of the patient were reviewed by me and considered in my medical decision making (see chart for details).    Discussed exam findings and plan of care with patient, strict go to ER precautions given.   Patient verbalized understanding to this provider.  Patient referred to PCP first avail appointment February 10, patient verbalized understanding this provider.  Ddx: Acute prostatitis, hematuria, constipation, smoker, no PCP Final Clinical Impressions(s) /  UC Diagnoses   Final diagnoses:  Hematuria, unspecified type  Does not have primary care provider  Smoker  Constipation, unspecified constipation type  Acute prostatitis     Discharge Instructions      We are going to treat you  for prostatitis, take antibiotic as directed, drink plenty of water, take stool softener to help with constipation, avoid straining.  Please keep appointment February 10 to get established with PCP, most likely will need referral to urology and GI for colonoscopy.  If you develop new or worsening symptoms go to the emergency room for further evaluation(chest pain,shortness of breath, abdominal pain, rectal pain, bleeding, etc).    ED Prescriptions     Medication Sig Dispense Auth. Provider   sulfamethoxazole -trimethoprim  (BACTRIM  DS) 800-160 MG tablet Take 1 tablet by mouth 2 (two) times daily for 28 days. 56 tablet Zacari Stiff, Rilla, NP      PDMP not reviewed this encounter.   Aminta Rilla, NP 11/20/23 1258

## 2023-11-21 LAB — URINE CULTURE
Culture: NO GROWTH
Special Requests: NORMAL

## 2023-12-22 ENCOUNTER — Ambulatory Visit: Payer: BC Managed Care – PPO | Admitting: Urology

## 2023-12-28 ENCOUNTER — Ambulatory Visit: Payer: BC Managed Care – PPO | Admitting: Physician Assistant

## 2023-12-28 ENCOUNTER — Other Ambulatory Visit: Payer: Self-pay | Admitting: Physician Assistant

## 2023-12-28 ENCOUNTER — Encounter: Payer: Self-pay | Admitting: Physician Assistant

## 2023-12-28 VITALS — BP 142/92 | HR 74 | Temp 98.1°F | Ht 69.0 in | Wt 262.0 lb

## 2023-12-28 DIAGNOSIS — R809 Proteinuria, unspecified: Secondary | ICD-10-CM | POA: Diagnosis not present

## 2023-12-28 DIAGNOSIS — E66812 Obesity, class 2: Secondary | ICD-10-CM

## 2023-12-28 DIAGNOSIS — Z23 Encounter for immunization: Secondary | ICD-10-CM | POA: Diagnosis not present

## 2023-12-28 DIAGNOSIS — K921 Melena: Secondary | ICD-10-CM | POA: Diagnosis not present

## 2023-12-28 DIAGNOSIS — N3001 Acute cystitis with hematuria: Secondary | ICD-10-CM | POA: Diagnosis not present

## 2023-12-28 DIAGNOSIS — Z1211 Encounter for screening for malignant neoplasm of colon: Secondary | ICD-10-CM

## 2023-12-28 DIAGNOSIS — R739 Hyperglycemia, unspecified: Secondary | ICD-10-CM

## 2023-12-28 DIAGNOSIS — Z125 Encounter for screening for malignant neoplasm of prostate: Secondary | ICD-10-CM

## 2023-12-28 DIAGNOSIS — R030 Elevated blood-pressure reading, without diagnosis of hypertension: Secondary | ICD-10-CM

## 2023-12-28 LAB — POCT URINALYSIS DIPSTICK
Bilirubin, UA: NEGATIVE
Blood, UA: NEGATIVE
Glucose, UA: NEGATIVE
Ketones, UA: NEGATIVE
Nitrite, UA: NEGATIVE
Protein, UA: POSITIVE — AB
Spec Grav, UA: 1.015 (ref 1.010–1.025)
Urobilinogen, UA: 0.2 U/dL
pH, UA: 7 (ref 5.0–8.0)

## 2023-12-28 MED ORDER — DOXYCYCLINE HYCLATE 100 MG PO TABS
100.0000 mg | ORAL_TABLET | Freq: Two times a day (BID) | ORAL | 0 refills | Status: AC
Start: 2023-12-28 — End: 2024-01-04

## 2023-12-28 NOTE — Patient Instructions (Signed)

## 2023-12-28 NOTE — Progress Notes (Signed)
 Date:  12/28/2023   Name:  Jonathan Schwartz [facility]   DOB:  10/31/1976   MRN:  [id number]   Chief Complaint: Establish Care, Colon Cancer Screening (X 6-7 months, Sharp pain in rectum, when having a BM feels a bump, not painful ), and Hematuria (X1-2 months, comes and goes )  HPI Arval is a very pleasant 48 y.o. male with a history of obesity and tobacco use who presents to the clinic today to establish care and address recent concerns for blood in the stool and urine for the past 2 months.  He was evaluated at urgent care 11/20/2023 where a dipstick showed trace leukocytes and rare bacteria, he was treated with Bactrim  for acute prostatitis but a prostate exam was not performed.  Patient states he was compliant with the Bactrim  for 21 days, but when he did not notice any improvement he decided to stop the antibiotic.  Presently he continues to report intermittent pink hue to the urine but denies other urinary complaints; specifically he denies urinary frequency, dysuria, hesitancy, nocturia, or penile pain.  He occasionally experiences a sharp pain in the rectum, sometimes notices some painless rectal bleeding and feels something when he wipes.  He has an appointment scheduled with urology 01/06/2024.  Last CBC and BMP from 09/27/2021 significant for leukocytosis and hyperglycemia with glucose 149.  Patient is not accustomed to routine care/PCP visits.  Knows he is due for colonoscopy, especially now with the rectal bleeding.   Medication list has been reviewed and updated.  Current Meds  Medication Sig   acetaminophen (TYLENOL) 500 MG tablet Take 500 mg by mouth every 6 (six) hours as needed.   doxycycline  (VIBRA -TABS) 100 MG tablet Take 1 tablet (100 mg total) by mouth 2 (two) times daily for 7 days. Do not take with dairy. This medication INCREASES SUN SENSITIVITY so avoid direct sunlight.   [DISCONTINUED] cyclobenzaprine  (FLEXERIL ) 10 MG tablet Take 1 tablet (10 mg total) by mouth 2  (two) times daily as needed for muscle spasms.   [DISCONTINUED] meloxicam  (MOBIC ) 15 MG tablet Take 1 tablet (15 mg total) by mouth daily as needed for pain.   [DISCONTINUED] naproxen  (NAPROSYN ) 500 MG tablet Take 1 tablet (500 mg total) by mouth 2 (two) times daily with a meal.   [DISCONTINUED] ondansetron  (ZOFRAN -ODT) 4 MG disintegrating tablet Take 1 tablet (4 mg total) by mouth every 8 (eight) hours as needed for nausea or vomiting.   [DISCONTINUED] predniSONE  (DELTASONE ) 20 MG tablet Take 2 tablets (40 mg total) by mouth daily.   [DISCONTINUED] traMADol  (ULTRAM ) 50 MG tablet Take 1 tablet (50 mg total) by mouth every 6 (six) hours as needed.     Review of Systems  Patient Active Problem List   Diagnosis Date Noted   Class 2 obesity 12/28/2023   Hematuria 11/20/2023   Tobacco use disorder 11/20/2023    No Known Allergies  Immunization History  Administered Date(s) Administered   Influenza, Seasonal, Injecte, Preservative Fre 12/28/2023   Tdap 09/28/2021    Past Surgical History:  Procedure Laterality Date   FRACTURE SURGERY     ORTHOPEDIC SURGERY      Social History   Tobacco Use   Smoking status: Every Day    Current packs/day: 0.50    Average packs/day: 0.5 packs/day for 30.1 years (15.1 ttl pk-yrs)    Types: Cigarettes    Start date: 1995   Smokeless tobacco: Never  Vaping Use   Vaping status: Never Used  Substance Use  Topics   Alcohol use: No   Drug use: No    Family History  Problem Relation Age of Onset   Hypertension Mother    Diabetes Mother    Hyperlipidemia Maternal Grandmother    Cancer Maternal Grandmother         12/28/2023    9:50 AM  GAD 7 : Generalized Anxiety Score  Nervous, Anxious, on Edge 1  Control/stop worrying 1  Worry too much - different things 3  Trouble relaxing 1  Restless 0  Easily annoyed or irritable 3  Afraid - awful might happen 3  Total GAD 7 Score 12  Anxiety Difficulty Somewhat difficult       12/28/2023     9:50 AM  Depression screen PHQ 2/9  Decreased Interest 3  Down, Depressed, Hopeless 0  PHQ - 2 Score 3  Altered sleeping 0  Tired, decreased energy 3  Change in appetite 3  Feeling bad or failure about yourself  0  Trouble concentrating 0  Moving slowly or fidgety/restless 0  Suicidal thoughts 0  PHQ-9 Score 9  Difficult doing work/chores Not difficult at all    BP Readings from Last 3 Encounters:  12/28/23 (!) 142/92  11/20/23 (!) 162/94  08/05/23 (!) 148/96    Wt Readings from Last 3 Encounters:  12/28/23 262 lb (118.8 kg)  11/20/23 262 lb (118.8 kg)  01/26/23 266 lb 15.6 oz (121.1 kg)    BP (!) 142/92   Pulse 74   Temp 98.1 F (36.7 C)   Ht 5\' 9"  (1.753 m)   Wt 262 lb (118.8 kg)   SpO2 98%   BMI 38.69 kg/m   Physical Exam Vitals and nursing note reviewed.  Constitutional:      Appearance: Normal appearance.  Cardiovascular:     Rate and Rhythm: Normal rate and regular rhythm.     Heart sounds: No murmur heard.    No friction rub. No gallop.  Pulmonary:     Effort: Pulmonary effort is normal.     Breath sounds: Normal breath sounds.  Abdominal:     General: There is no distension.  Genitourinary:    Prostate: Not enlarged, not tender and no nodules present.     Rectum: Normal. Guaiac result negative. No mass, anal fissure or external hemorrhoid.     Comments: Genital exam deferred Musculoskeletal:        General: Normal range of motion.  Skin:    General: Skin is warm and dry.  Neurological:     Mental Status: He is alert and oriented to person, place, and time.     Gait: Gait is intact.  Psychiatric:        Mood and Affect: Mood and affect normal.    Lab Results  Component Value Date   COLORU dark yellow 12/28/2023   CLARITYU cloudy 12/28/2023   GLUCOSEUR Negative 12/28/2023   BILIRUBINUR neg 12/28/2023   KETONESU neg 12/28/2023   SPECGRAV 1.015 12/28/2023   RBCUR neg 12/28/2023   PHUR 7.0 12/28/2023   PROTEINUR Positive (A) 12/28/2023    UROBILINOGEN 0.2 12/28/2023   LEUKOCYTESUR Large (3+) (A) 12/28/2023     Recent Labs     Component Value Date/Time   NA 141 09/27/2021 2348   K 3.5 09/27/2021 2348   CL 108 09/27/2021 2348   CO2 25 09/27/2021 2348   GLUCOSE 149 (H) 09/27/2021 2348   BUN 20 09/27/2021 2348   CREATININE 0.97 09/27/2021 2348   CALCIUM 9.5 09/27/2021  2348   GFRNONAA >60 09/27/2021 2348    Lab Results  Component Value Date   WBC 11.9 (H) 09/27/2021   HGB 14.7 09/27/2021   HCT 41.5 09/27/2021   MCV 93.0 09/27/2021   PLT 289 09/27/2021   No results found for: "HGBA1C" No results found for: "CHOL", "HDL", "LDLCALC", "LDLDIRECT", "TRIG", "CHOLHDL" No results found for: "TSH"   Assessment and Plan:  1. Acute cystitis with hematuria (Primary) Dipstick significant for large leukocytes and protein.  Normal prostate exam without overt tenderness.  Sending urine for culture, but in the meantime we will start doxycycline  twice daily for 7 days, which should be active against most UTI and prostatitis pathogens.  Low suspicion for prostatitis at this time.  - POCT Urinalysis Dipstick - Urine Culture - doxycycline  (VIBRA -TABS) 100 MG tablet; Take 1 tablet (100 mg total) by mouth 2 (two) times daily for 7 days. Do not take with dairy. This medication INCREASES SUN SENSITIVITY so avoid direct sunlight.  Dispense: 14 tablet; Refill: 0 - Comprehensive metabolic panel - CBC with Differential/Platelet - PSA  2. Proteinuria, unspecified type Possibly from UTI, check CMP, consider also diabetes so we will screen with A1c today  3. Blood in stool Check routine labs today, including CBC to evaluate for anemia.  Referred to GI today, appointment has been scheduled for 02/08/2024. - Comprehensive metabolic panel - CBC with Differential/Platelet - Ambulatory referral to Gastroenterology - PSA  4. Screening for colon cancer Referred to GI today, appointment has been scheduled for 02/08/2024. - Ambulatory referral  to Gastroenterology  5. Screening PSA (prostate specific antigen) - PSA  6. Hyperglycemia - Comprehensive metabolic panel - CBC with Differential/Platelet - Hemoglobin A1c  7. Class 2 obesity - Comprehensive metabolic panel - CBC with Differential/Platelet - Hemoglobin A1c  8. Elevated blood pressure reading in office without diagnosis of hypertension Elevated in clinic today, but understandably so given the nature of his complaint and first visit here.  Suspect element of whitecoat hypertension, but will follow clinically.  9. Immunization due Flu shot administered today - Flu vaccine trivalent PF, 6mos and older(Flulaval,Afluria,Fluarix,Fluzone)   Return in about 3 months (around 03/26/2024) for CPE.    Today's visit billed for provider time of 46 minutes including chart review, physical exam, addressing multiple problems, ordering and interpretation of labs.  Cody Das, PA-C, DMSc, Nutritionist [facility] [phone]

## 2023-12-30 LAB — URINE CULTURE: Organism ID, Bacteria: NO GROWTH

## 2024-01-06 ENCOUNTER — Ambulatory Visit: Payer: Self-pay | Admitting: Urology

## 2024-01-29 ENCOUNTER — Other Ambulatory Visit: Payer: Self-pay

## 2024-01-29 DIAGNOSIS — R319 Hematuria, unspecified: Secondary | ICD-10-CM

## 2024-02-02 ENCOUNTER — Ambulatory Visit: Payer: BC Managed Care – PPO | Admitting: Urology

## 2024-02-08 ENCOUNTER — Ambulatory Visit: Payer: BC Managed Care – PPO | Admitting: Gastroenterology

## 2024-02-24 ENCOUNTER — Ambulatory Visit: Admitting: Urology

## 2024-02-24 ENCOUNTER — Encounter: Payer: Self-pay | Admitting: Urology

## 2024-03-28 ENCOUNTER — Encounter: Payer: BC Managed Care – PPO | Admitting: Physician Assistant
